# Patient Record
Sex: Male | Born: 1955 | Race: White | Hispanic: No | Marital: Single | Smoking: Current every day smoker
Health system: Southern US, Community
[De-identification: ages and names within clinical notes are randomized; demographics above are authoritative.]

## PROBLEM LIST (undated history)

## (undated) DIAGNOSIS — J449 Chronic obstructive pulmonary disease, unspecified: Secondary | ICD-10-CM

## (undated) DIAGNOSIS — I10 Essential (primary) hypertension: Secondary | ICD-10-CM

## (undated) HISTORY — DX: Chronic obstructive pulmonary disease, unspecified: J44.9

## (undated) HISTORY — DX: Essential (primary) hypertension: I10

## (undated) HISTORY — PX: NO PAST SURGERIES: SHX2092

---

## 2011-08-03 ENCOUNTER — Ambulatory Visit (INDEPENDENT_AMBULATORY_CARE_PROVIDER_SITE_OTHER): Payer: 59 | Admitting: Internal Medicine

## 2011-08-03 ENCOUNTER — Encounter: Payer: Self-pay | Admitting: Internal Medicine

## 2011-08-03 DIAGNOSIS — Z23 Encounter for immunization: Secondary | ICD-10-CM

## 2011-08-03 DIAGNOSIS — I10 Essential (primary) hypertension: Secondary | ICD-10-CM | POA: Insufficient documentation

## 2011-08-03 DIAGNOSIS — Z0000 Encounter for general adult medical examination without abnormal findings: Secondary | ICD-10-CM

## 2011-08-03 LAB — CBC WITH DIFFERENTIAL/PLATELET
Basophils Absolute: 0.1 10*3/uL (ref 0.0–0.1)
Eosinophils Relative: 1.5 % (ref 0.0–5.0)
HCT: 51.1 % (ref 39.0–52.0)
Hemoglobin: 17.3 g/dL — ABNORMAL HIGH (ref 13.0–17.0)
Lymphocytes Relative: 20.3 % (ref 12.0–46.0)
Monocytes Relative: 7.6 % (ref 3.0–12.0)
Neutro Abs: 5.1 10*3/uL (ref 1.4–7.7)
RBC: 5.23 Mil/uL (ref 4.22–5.81)
RDW: 13.3 % (ref 11.5–14.6)
WBC: 7.4 10*3/uL (ref 4.5–10.5)

## 2011-08-03 LAB — COMPREHENSIVE METABOLIC PANEL
ALT: 38 U/L (ref 0–53)
BUN: 7 mg/dL (ref 6–23)
CO2: 29 mEq/L (ref 19–32)
Calcium: 9.5 mg/dL (ref 8.4–10.5)
Chloride: 103 mEq/L (ref 96–112)
Creatinine, Ser: 0.8 mg/dL (ref 0.4–1.5)
GFR: 102.04 mL/min (ref >60.00)
Glucose, Bld: 100 mg/dL — ABNORMAL HIGH (ref 70–99)
Total Bilirubin: 0.8 mg/dL (ref 0.3–1.2)

## 2011-08-03 LAB — LIPID PANEL
HDL: 42.6 mg/dL (ref >39.00)
LDL Cholesterol: 126 mg/dL — ABNORMAL HIGH (ref 0–99)
VLDL: 13.2 mg/dL (ref 0.0–40.0)

## 2011-08-03 MED ORDER — AMLODIPINE BESYLATE 5 MG PO TABS: 5.0000 mg | ORAL_TABLET | Freq: Every day | ORAL | Status: DC

## 2011-08-03 NOTE — Assessment & Plan Note (Addendum)
Patient reports that his systolic blood pressure last week at work was around 200, today his blood pressure is 158/98. Plan: EKG, nsr Labs Start amlodipine 5 mg, side effects discussed, see instructions.

## 2011-08-03 NOTE — Assessment & Plan Note (Addendum)
Last TD? Gave one today we ran out of flushots, rec to get one at the pharmacy Never had a cscope , colon cancer screening reviewed with the patient, pros and cons of colonoscopy versus Hemoccults discussed. Provided an iFOB, will call when ready for a colonoscopy. Counseled about diet, exercise. Risks of tobacco discussed including COPD, cancer. Also recommend to see a dentist at least every 6 months for screening of oral cancer.

## 2011-08-03 NOTE — Progress Notes (Signed)
  Subjective:    Patient ID: Christopher Ferrell, male    DOB: 1955/11/18, 56 y.o.   MRN: [id number]  HPI New patient , complete physical exam. Was found to have high blood pressure last week at his job, systolic BP ~ 200?Marland Kitchen Was recommended  to have a CPX, has not seen a doctor in more than 12 years  Past medical history Elevated BP noted 07-2011  Past surgical history None  Social history Single, lives w/ fiance no children Moved to [place] ~ 2000 from [place] Tobacco-- 1 ppd ETOH-- socially Diet-- regular  Exercise-- active at work, Furniture conservator/restorer , yard work   Family history Diabetes--no CAD--no [place], father, type? Colon cancer--no Prostate cancer--no    Review of Systems  Constitutional: Negative for fever and unexpected weight change.  Respiratory: Negative for cough, shortness of breath and wheezing.   Cardiovascular: Negative for chest pain and leg swelling.  Gastrointestinal: Negative for abdominal pain and blood in stool.  Genitourinary: Negative for dysuria and hematuria.  Neurological: Negative for headaches.  Psychiatric/Behavioral:       No anxiety-depression       Objective:   Physical Exam  Constitutional: He is oriented to person, place, and time. He appears well-developed and well-nourished. No distress.  HENT:  Head: Normocephalic and atraumatic.  Neck: No thyromegaly present.       Normal  carotid pulse  Cardiovascular: Normal rate, regular rhythm and normal heart sounds.   No murmur heard. Pulmonary/Chest: Effort normal and breath sounds normal. No respiratory distress. He has no wheezes. He has no rales.  Abdominal: Soft. He exhibits no distension. There is no tenderness. There is no rebound and no guarding.  Genitourinary: Rectum normal and prostate normal. Guaiac negative stool.  Musculoskeletal: He exhibits no edema.  Neurological: He is alert and oriented to person, place, and time.  Skin: He is not diaphoretic.  Psychiatric: He has  a normal mood and affect. His behavior is normal. Judgment and thought content normal.       Assessment & Plan:

## 2011-08-03 NOTE — Patient Instructions (Signed)
Think about stopping tobacco See a dentist every 6 months Diet! Exercise 30 minutes a day Start amlodipine for BP Check the  blood pressure 2 or 3 times a week, be sure is between 110/60 and 140/85. If it is consistently higher or lower , let me know Came back in 2 months

## 2011-08-04 ENCOUNTER — Encounter: Payer: Self-pay | Admitting: Internal Medicine

## 2011-08-05 LAB — TSH: TSH: 1.08 u[IU]/mL (ref 0.35–5.50)

## 2011-08-09 ENCOUNTER — Telehealth: Payer: Self-pay | Admitting: *Deleted

## 2011-08-09 NOTE — Telephone Encounter (Signed)
Spoke with pt gave lab results

## 2011-08-10 ENCOUNTER — Telehealth: Payer: Self-pay | Admitting: *Deleted

## 2011-08-10 ENCOUNTER — Encounter: Payer: Self-pay | Admitting: *Deleted

## 2011-08-10 NOTE — Telephone Encounter (Signed)
Refill done.  

## 2011-08-10 NOTE — Telephone Encounter (Signed)
Start losartan 100 mg 1 po qd #30, 0 RF Watch for sx of low BP (weak-tired-dizzy), if that is the case, take only 1/2 losartan and let me know  OV to see me in 3 weeks

## 2011-08-10 NOTE — Telephone Encounter (Signed)
Phone pt & scheduled an appt for 2.27.13. Pt would like to know if he needs to continue taking the Norvasc as well as the losartan or just the losartan? Please advise.

## 2011-08-10 NOTE — Telephone Encounter (Signed)
Continue with Norvasc 

## 2011-08-10 NOTE — Telephone Encounter (Signed)
Pt came in to have his BP checked & it was 200/98. Does pt need an OV or med change? Please advise.

## 2011-08-11 ENCOUNTER — Other Ambulatory Visit: Payer: Self-pay | Admitting: *Deleted

## 2011-08-11 MED ORDER — LOSARTAN POTASSIUM 100 MG PO TABS: 100.0000 mg | ORAL_TABLET | Freq: Every day | ORAL | Status: DC

## 2011-08-31 ENCOUNTER — Ambulatory Visit (INDEPENDENT_AMBULATORY_CARE_PROVIDER_SITE_OTHER): Payer: 59 | Admitting: Internal Medicine

## 2011-08-31 VITALS — BP 178/92 | HR 76 | Temp 98.7°F | Wt 222.0 lb

## 2011-08-31 DIAGNOSIS — I10 Essential (primary) hypertension: Secondary | ICD-10-CM

## 2011-08-31 DIAGNOSIS — R972 Elevated prostate specific antigen [PSA]: Secondary | ICD-10-CM

## 2011-08-31 LAB — BASIC METABOLIC PANEL
Chloride: 100 mEq/L (ref 96–112)
Potassium: 4 mEq/L (ref 3.5–5.1)

## 2011-08-31 MED ORDER — AMLODIPINE BESYLATE 10 MG PO TABS: 10.0000 mg | ORAL_TABLET | Freq: Every day | ORAL | Status: DC

## 2011-08-31 MED ORDER — HYDROCHLOROTHIAZIDE 25 MG PO TABS: 25.0000 mg | ORAL_TABLET | Freq: Every day | ORAL | Status: DC

## 2011-08-31 NOTE — Progress Notes (Signed)
  Subjective:    Patient ID: Christopher Ferrell, male    DOB: [birth year], 56 y.o.   MRN: [id number]  HPI Followup from previous visit, his blood pressure was elevated, was started on medication, currently on Cozaar Norvasc. amb BP still elevated at around 170. BP today 178/92. I rechecked his BP personally, I obtained 190/90 in both arms.  Past medical history  HTN dx 07-2011  Past surgical history  None    Review of Systems  does not eat a lot of salt Denies any cough, lower extremity edema, headache, nausea vomiting or diarrhea. Also denies feeling sleepy or unrefreshed in the morning. Very mild snoring. No dysuria or difficulty urinating.     Objective:   Physical Exam  alert, oriented x3, no apparent distress. Lungs are clear to auscultation bilaterally Cardiovascular regular rate and rhythm without murmur. Extremities without edema. Neurological exam alert oriented x3, speech gait and motor intact       Assessment & Plan:

## 2011-08-31 NOTE — Assessment & Plan Note (Addendum)
BP continues to be elevated despite compliance w/ medicines. Review of systems doesn't  point to   sleep apnea Baseline potassium normal Plan: BMP today Increase amlodipine to 10 mg Hydrochlorothiazide Office visit in 3 weeks

## 2011-08-31 NOTE — Assessment & Plan Note (Signed)
asx , recheck in 5 months

## 2011-09-01 ENCOUNTER — Encounter: Payer: Self-pay | Admitting: Internal Medicine

## 2011-09-02 ENCOUNTER — Encounter: Payer: Self-pay | Admitting: *Deleted

## 2011-09-07 ENCOUNTER — Other Ambulatory Visit: Payer: Self-pay | Admitting: Internal Medicine

## 2011-09-08 NOTE — Telephone Encounter (Signed)
Refill done.  

## 2011-09-21 ENCOUNTER — Encounter: Payer: Self-pay | Admitting: Internal Medicine

## 2011-09-21 ENCOUNTER — Ambulatory Visit (INDEPENDENT_AMBULATORY_CARE_PROVIDER_SITE_OTHER): Payer: 59 | Admitting: Internal Medicine

## 2011-09-21 VITALS — BP 158/84 | HR 63 | Temp 98.3°F | Wt 221.0 lb

## 2011-09-21 DIAGNOSIS — I10 Essential (primary) hypertension: Secondary | ICD-10-CM

## 2011-09-21 MED ORDER — AMLODIPINE BESYLATE 10 MG PO TABS: 10.0000 mg | ORAL_TABLET | Freq: Every day | ORAL | Status: DC

## 2011-09-21 MED ORDER — HYDROCHLOROTHIAZIDE 25 MG PO TABS: 25.0000 mg | ORAL_TABLET | Freq: Every day | ORAL | Status: DC

## 2011-09-21 NOTE — Patient Instructions (Signed)
Check the  blood pressure 2 times a week, be sure it is less than 145/85. If it is consistently higher, let me know

## 2011-09-21 NOTE — Progress Notes (Signed)
  Subjective:    Patient ID: Christopher Ferrell, male    DOB: 1956/05/08, 56 y.o.   MRN: [id number]  HPI Hypertension followup, good medication compliance.  Past medical history  HTN dx 07-2011   Past surgical history  None   Review of Systems Denies any nausea, vomiting or diarrhea No chest pain or shortness of breath. No lower extremity edema. Denies LE cramps He does feel slightly tired lately and today, this is going on for a few days only. Ambulatory blood pressure checked x 1 was in the 150s.    Objective:   Physical Exam  Alert oriented x3, no apparent distress Lower extremities without edema      Assessment & Plan:

## 2011-09-21 NOTE — Assessment & Plan Note (Signed)
Since the last office visit, we increased the dose of amlodipine and added a diuretic. Good  compliance, no side effects except for mild fatigue in the afternoon. His blood pressure is better, I think it will continue to improve as he continued to take a higher dose of amlodipine. Plan: No change, RFs done, check a BMP. Will let me know if the  afternoon fatigue continue or get worse.

## 2011-09-22 LAB — BASIC METABOLIC PANEL
CO2: 27 mEq/L (ref 19–32)
Glucose, Bld: 91 mg/dL (ref 70–99)
Potassium: 4 mEq/L (ref 3.5–5.1)
Sodium: 131 mEq/L — ABNORMAL LOW (ref 135–145)

## 2011-09-29 ENCOUNTER — Other Ambulatory Visit: Payer: Self-pay | Admitting: *Deleted

## 2011-09-29 DIAGNOSIS — Z0000 Encounter for general adult medical examination without abnormal findings: Secondary | ICD-10-CM

## 2011-10-03 ENCOUNTER — Ambulatory Visit: Payer: 59 | Admitting: Internal Medicine

## 2011-10-27 ENCOUNTER — Other Ambulatory Visit (INDEPENDENT_AMBULATORY_CARE_PROVIDER_SITE_OTHER): Payer: 59

## 2011-10-27 DIAGNOSIS — I10 Essential (primary) hypertension: Secondary | ICD-10-CM

## 2011-10-27 LAB — BASIC METABOLIC PANEL
BUN: 8 mg/dL (ref 6–23)
CO2: 27 mEq/L (ref 19–32)
Chloride: 96 mEq/L (ref 96–112)
Creatinine, Ser: 0.8 mg/dL (ref 0.4–1.5)

## 2011-10-27 NOTE — Progress Notes (Signed)
Lab only 

## 2011-11-01 ENCOUNTER — Encounter: Payer: Self-pay | Admitting: *Deleted

## 2012-01-30 ENCOUNTER — Encounter: Payer: Self-pay | Admitting: Internal Medicine

## 2012-01-30 ENCOUNTER — Ambulatory Visit (INDEPENDENT_AMBULATORY_CARE_PROVIDER_SITE_OTHER): Payer: 59 | Admitting: Internal Medicine

## 2012-01-30 VITALS — BP 152/80 | HR 72 | Temp 98.2°F | Wt 212.0 lb

## 2012-01-30 DIAGNOSIS — R972 Elevated prostate specific antigen [PSA]: Secondary | ICD-10-CM

## 2012-01-30 DIAGNOSIS — I10 Essential (primary) hypertension: Secondary | ICD-10-CM

## 2012-01-30 MED ORDER — AMLODIPINE BESYLATE 10 MG PO TABS: 10.0000 mg | ORAL_TABLET | Freq: Every day | ORAL | Status: DC

## 2012-01-30 MED ORDER — HYDROCHLOROTHIAZIDE 25 MG PO TABS: 25.0000 mg | ORAL_TABLET | Freq: Every day | ORAL | Status: DC

## 2012-01-30 NOTE — Progress Notes (Signed)
  Subjective:    Patient ID: Christopher Ferrell, male    DOB: [birth year], 56 y.o.   MRN: [id number]  HPI Routine followup Hypertension, good medication compliance, ambulatory BPs are checked weekly, readings range between 125-140/70. Better readings are at night when he relaxes.  Past medical history   HTN dx 07-2011   Past surgical history   None  Social history Single, lives w/ fiance no children Moved to [place] ~ 2000 from [place] Tobacco-- 1 ppd ETOH-- socially Diet-- regular   Exercise-- active at work, Furniture conservator/restorer , yard work   Family history Diabetes--no CAD--no [place], father, type? Colon cancer--no Prostate cancer--no  Review of Systems No chest pain or shortness of breath Note nausea, vomiting, diarrhea. No dysuria or gross hematuria.     Objective:   Physical Exam  General -- alert, well-developed, and well-nourished.   Lungs -- normal respiratory effort, no intercostal retractions, no accessory muscle use, and normal breath sounds.   Heart-- normal rate, regular rhythm, no murmur, and no gallop.   Extremities-- no pretibial edema bilaterally Psych-- Cognition and judgment appear intact. Alert and cooperative with normal attention span and concentration.  not anxious appearing and not depressed appearing.      Assessment & Plan:

## 2012-01-30 NOTE — Patient Instructions (Addendum)
Check the  blood pressure 2 or 3 times a week, be sure it is between 110/60 and 140/85. If it is consistently higher or lower, let me know  

## 2012-01-30 NOTE — Assessment & Plan Note (Signed)
BP today slightly elevated but seems reasonably well controlled at home. No change, we'll continue monitoring his BP, see instructions

## 2012-01-30 NOTE — Assessment & Plan Note (Signed)
asymptomatic, DRE around 07-2011 normal. Plan, check a PSA

## 2012-01-31 ENCOUNTER — Encounter: Payer: Self-pay | Admitting: *Deleted

## 2012-01-31 LAB — PSA: PSA: 1.86 ng/mL (ref 0.10–4.00)

## 2012-03-19 ENCOUNTER — Other Ambulatory Visit: Payer: Self-pay | Admitting: Internal Medicine

## 2012-03-19 MED ORDER — LOSARTAN POTASSIUM 100 MG PO TABS: 100.0000 mg | ORAL_TABLET | Freq: Every day | ORAL | Status: DC

## 2012-03-19 NOTE — Telephone Encounter (Signed)
Refill done.  

## 2012-03-19 NOTE — Telephone Encounter (Signed)
refill losartan 100mg  tablets #30 last fill 9.5.13 no instructions listed Last ov 7.29.13 74-month follow up

## 2012-08-06 ENCOUNTER — Encounter: Payer: Self-pay | Admitting: Internal Medicine

## 2012-08-06 ENCOUNTER — Ambulatory Visit (INDEPENDENT_AMBULATORY_CARE_PROVIDER_SITE_OTHER): Payer: 59 | Admitting: Internal Medicine

## 2012-08-06 VITALS — BP 154/86 | HR 92 | Temp 97.8°F | Ht 75.0 in | Wt 217.0 lb

## 2012-08-06 DIAGNOSIS — Z0000 Encounter for general adult medical examination without abnormal findings: Secondary | ICD-10-CM

## 2012-08-06 DIAGNOSIS — Z23 Encounter for immunization: Secondary | ICD-10-CM

## 2012-08-06 DIAGNOSIS — N529 Male erectile dysfunction, unspecified: Secondary | ICD-10-CM

## 2012-08-06 DIAGNOSIS — Z720 Tobacco use: Secondary | ICD-10-CM

## 2012-08-06 DIAGNOSIS — I10 Essential (primary) hypertension: Secondary | ICD-10-CM

## 2012-08-06 LAB — CBC WITH DIFFERENTIAL/PLATELET
Basophils Relative: 0.8 % (ref 0.0–3.0)
Eosinophils Absolute: 0.1 10*3/uL (ref 0.0–0.7)
Eosinophils Relative: 1.3 % (ref 0.0–5.0)
HCT: 45.9 % (ref 39.0–52.0)
Hemoglobin: 16 g/dL (ref 13.0–17.0)
Lymphs Abs: 1.4 10*3/uL (ref 0.7–4.0)
MCHC: 34.8 g/dL (ref 30.0–36.0)
MCV: 94.9 fl (ref 78.0–100.0)
Monocytes Absolute: 0.8 10*3/uL (ref 0.1–1.0)
Neutro Abs: 5.6 10*3/uL (ref 1.4–7.7)
RBC: 4.84 Mil/uL (ref 4.22–5.81)
WBC: 8.1 10*3/uL (ref 4.5–10.5)

## 2012-08-06 LAB — COMPREHENSIVE METABOLIC PANEL
AST: 29 U/L (ref 0–37)
Albumin: 4.4 g/dL (ref 3.5–5.2)
Alkaline Phosphatase: 87 U/L (ref 39–117)
BUN: 6 mg/dL (ref 6–23)
Creatinine, Ser: 0.8 mg/dL (ref 0.4–1.5)
Potassium: 3.9 mEq/L (ref 3.5–5.1)
Total Bilirubin: 1.2 mg/dL (ref 0.3–1.2)

## 2012-08-06 LAB — LIPID PANEL
LDL Cholesterol: 98 mg/dL (ref 0–99)
Total CHOL/HDL Ratio: 4

## 2012-08-06 LAB — PSA: PSA: 2.86 ng/mL (ref 0.10–4.00)

## 2012-08-06 NOTE — Assessment & Plan Note (Signed)
Risks of tobacco discussed -- COPD, cancer. Information about quitting provided. Also recommend to see a dentist at least every 6 months for screening of oral cancer. She has episodes of cough, decreased breath sounds, suspect COPD. Will check a chest x-ray and PFTs.

## 2012-08-06 NOTE — Assessment & Plan Note (Addendum)
Today, he also reports  ED, Viagra?. I see no contraindication, samples and how to use the medication provided, will call if he likes a refill. Interactions with nitroglycerin discussed

## 2012-08-06 NOTE — Assessment & Plan Note (Addendum)
BP slightly elevated today but better ambulatory BPs. Recommend to keep checking his BP i, to call if more than 140/85 consistently

## 2012-08-06 NOTE — Patient Instructions (Addendum)
Please get your x-ray at the other [facility]  office located at: [address] [place], across from [facility].  Please go to the basement, this is a walk-in facility, they are open from 8:30 to 5:30 PM. Phone number [phone]. --- Please come back in 6 months for a regular checkup

## 2012-08-06 NOTE — Progress Notes (Signed)
  Subjective:    Patient ID: Christopher Ferrell, male    DOB: 1955/08/24, 57 y.o.   MRN: [id number]  HPI CPX  Past Medical History  Diagnosis Date  . Hypertension    Past Surgical History  Procedure Date  . No past surgeries    History   Social History  . Marital Status: Unknown    Spouse Name: N/A    Number of Children: 2  . Years of Education: N/A   Occupational History  . Furniture conservator/restorer    Social History Main Topics  . Smoking status: Current Every Day Smoker    Types: Cigarettes  . Smokeless tobacco: Never Used     Comment: 1/2 ppd   . Alcohol Use: Yes     Comment: socially   . Drug Use: No  . Sexually Active: Not on file   Other Topics Concern  . Not on file   Social History Narrative   Single, lives w/ fiance no children ---Moved to [place] ~ 2000 from [place] --Diet: regular  ---Exercise-- active at work, Furniture conservator/restorer , yard work; no routine exercise     Family history Diabetes--no CAD--no [place], father, type? Colon cancer--no Prostate cancer--no   Review of Systems Ambulatory BPs around 140/80. Good medication compliance. Denies chest pain, shortness of breath. Admits to episodes of cough, mostly in the afternoon, around 4 times a week, + whitish sputum. Denies hemoptysis. He continue smoking ~ half pack a day. Denies anxiety or depression. Also reports ED, since he started to take some of the BP meds. Cannot pin point to which  med exactly is causing the problem. No dysuria, gross hematuria or difficulty urinating    Objective:   Physical Exam General -- alert, well-developed, and well-nourished.   Neck --no thyromegaly , normal carotid pulse Lungs -- normal respiratory effort, no intercostal retractions, no accessory muscle use, and decreased  breath sounds.   Heart-- normal rate, regular rhythm, no murmur, and no gallop.   Abdomen--soft, non-tender, no distention, no masses  Rectal-- No external abnormalities noted. Normal sphincter  tone. No rectal masses or tenderness. Brown stool, Hemoccult negative Prostate:   Prostate is quite large however is soft, nontender, not nodular Neurologic-- alert & oriented X3 and strength normal in all extremities. Psych-- Cognition and judgment appear intact. Alert and cooperative with normal attention span and concentration.  not anxious appearing and not depressed appearing.       Assessment & Plan:

## 2012-08-06 NOTE — Assessment & Plan Note (Addendum)
Last TD 2013 Never got a flu shot  Pneumonia shot today Never had a cscope , again discussed colon cancer screening : pros and cons of colonoscopy versus Hemoccults discussed. Provided an iFOB, will call when ready for a colonoscopy. Counseled about diet, exercise. Prostate is quite large, he is asymptomatic. PSA at some point was elevated but went down to normal without any treatment. Plan: Check a PSA

## 2012-08-07 ENCOUNTER — Ambulatory Visit (INDEPENDENT_AMBULATORY_CARE_PROVIDER_SITE_OTHER)
Admission: RE | Admit: 2012-08-07 | Discharge: 2012-08-07 | Disposition: A | Discharge status: Home / Self Care | Payer: 59 | Source: Ambulatory Visit | Attending: Internal Medicine | Admitting: Internal Medicine

## 2012-08-07 DIAGNOSIS — F17200 Nicotine dependence, unspecified, uncomplicated: Secondary | ICD-10-CM

## 2012-08-07 DIAGNOSIS — Z720 Tobacco use: Secondary | ICD-10-CM

## 2012-08-14 ENCOUNTER — Encounter: Payer: Self-pay | Admitting: *Deleted

## 2012-08-14 MED ORDER — CARVEDILOL 12.5 MG PO TABS: 12.5000 mg | ORAL_TABLET | Freq: Two times a day (BID) | ORAL | Status: DC

## 2012-08-14 NOTE — Addendum Note (Signed)
Addended by: Edwena Felty T on: 08/14/2012 04:39 PM   Modules accepted: Orders

## 2012-08-15 ENCOUNTER — Telehealth: Payer: Self-pay | Admitting: *Deleted

## 2012-08-15 MED ORDER — LOSARTAN POTASSIUM 100 MG PO TABS: 100.0000 mg | ORAL_TABLET | Freq: Every day | ORAL | Status: DC

## 2012-08-15 NOTE — Telephone Encounter (Signed)
Refill done.  

## 2012-08-23 ENCOUNTER — Telehealth: Payer: Self-pay | Admitting: *Deleted

## 2012-08-23 DIAGNOSIS — I10 Essential (primary) hypertension: Secondary | ICD-10-CM

## 2012-08-23 NOTE — Telephone Encounter (Signed)
Discussed with pt. Scheduled lab appt, entered orders.

## 2012-08-23 NOTE — Telephone Encounter (Signed)
Message copied by Nada Maclachlan on Thu Aug 23, 2012  8:35 AM ------      Message from: Marshell Garfinkel      Created: Wed Aug 22, 2012 11:23 AM      Contact: [phone]       Patient states he does not understand his lab results. He would like a call back. ------

## 2012-08-24 ENCOUNTER — Ambulatory Visit (INDEPENDENT_AMBULATORY_CARE_PROVIDER_SITE_OTHER): Payer: 59 | Admitting: Internal Medicine

## 2012-08-24 DIAGNOSIS — F17200 Nicotine dependence, unspecified, uncomplicated: Secondary | ICD-10-CM

## 2012-08-24 DIAGNOSIS — Z720 Tobacco use: Secondary | ICD-10-CM

## 2012-08-24 LAB — PULMONARY FUNCTION TEST

## 2012-08-24 NOTE — Progress Notes (Signed)
PFT done today. 

## 2012-09-03 ENCOUNTER — Telehealth: Payer: Self-pay | Admitting: Internal Medicine

## 2012-09-03 NOTE — Telephone Encounter (Signed)
Advise pt: CXR and PFTs showed mild COPD. First treatment step is to quit tobacco as discussed during OV If he develop sx : persistent cough, SOB --->  let us know

## 2012-09-04 NOTE — Telephone Encounter (Signed)
lmovm for pt to return call.  

## 2012-09-06 NOTE — Telephone Encounter (Signed)
Discussed with pt

## 2012-09-06 NOTE — Telephone Encounter (Signed)
lmovm for pt to return call.  

## 2012-09-17 ENCOUNTER — Other Ambulatory Visit (INDEPENDENT_AMBULATORY_CARE_PROVIDER_SITE_OTHER): Payer: 59

## 2012-09-17 ENCOUNTER — Telehealth: Payer: Self-pay | Admitting: *Deleted

## 2012-09-17 ENCOUNTER — Other Ambulatory Visit: Payer: 59

## 2012-09-17 DIAGNOSIS — I10 Essential (primary) hypertension: Secondary | ICD-10-CM

## 2012-09-17 NOTE — Telephone Encounter (Signed)
Pt came in stating that at the last OV samples of viagra were givin & the pt stated that it worked well & he would now like an rx sent to his pharmacy. Please advise.

## 2012-09-18 LAB — BASIC METABOLIC PANEL
BUN: 10 mg/dL (ref 6–23)
Calcium: 9.2 mg/dL (ref 8.4–10.5)
Creatinine, Ser: 0.9 mg/dL (ref 0.4–1.5)
GFR: 92.56 mL/min (ref >60.00)

## 2012-09-18 MED ORDER — SILDENAFIL CITRATE 100 MG PO TABS: 100.0000 mg | ORAL_TABLET | Freq: Every day | ORAL | Status: DC | PRN

## 2012-09-18 NOTE — Telephone Encounter (Signed)
Pt made aware rx has been sent to pharmacy.

## 2012-09-18 NOTE — Telephone Encounter (Signed)
Done, let pt know.

## 2012-09-20 ENCOUNTER — Encounter: Payer: Self-pay | Admitting: *Deleted

## 2012-10-23 ENCOUNTER — Other Ambulatory Visit: Payer: Self-pay | Admitting: Internal Medicine

## 2012-10-24 NOTE — Telephone Encounter (Signed)
Refill done.  

## 2013-02-04 ENCOUNTER — Ambulatory Visit (INDEPENDENT_AMBULATORY_CARE_PROVIDER_SITE_OTHER): Payer: 59 | Admitting: Internal Medicine

## 2013-02-04 ENCOUNTER — Encounter: Payer: Self-pay | Admitting: Internal Medicine

## 2013-02-04 VITALS — BP 170/85 | HR 63 | Temp 98.2°F | Wt 212.2 lb

## 2013-02-04 DIAGNOSIS — J3489 Other specified disorders of nose and nasal sinuses: Secondary | ICD-10-CM | POA: Insufficient documentation

## 2013-02-04 DIAGNOSIS — J449 Chronic obstructive pulmonary disease, unspecified: Secondary | ICD-10-CM

## 2013-02-04 DIAGNOSIS — F17200 Nicotine dependence, unspecified, uncomplicated: Secondary | ICD-10-CM

## 2013-02-04 DIAGNOSIS — Z720 Tobacco use: Secondary | ICD-10-CM

## 2013-02-04 DIAGNOSIS — I10 Essential (primary) hypertension: Secondary | ICD-10-CM

## 2013-02-04 DIAGNOSIS — Z0000 Encounter for general adult medical examination without abnormal findings: Secondary | ICD-10-CM

## 2013-02-04 NOTE — Assessment & Plan Note (Signed)
Right nostril lesion for a few months, refer to ENT

## 2013-02-04 NOTE — Assessment & Plan Note (Signed)
Chronic smoker, COPD documented by chest x-ray and PFTs 09-2012. Currently asymptomatic. Recently added a beta blockers for BP and fortunately that has not affected him

## 2013-02-04 NOTE — Assessment & Plan Note (Signed)
Since the last time he was here, we discontinue the diuretic due to to hyponatremia, he is now on Coreg. Ambulatory BPs are excellent, BP today slightly elevated. Plan: BMP Continue monitoring his BP, no change.

## 2013-02-04 NOTE — Assessment & Plan Note (Signed)
Likes a colonoscopy referral, will do

## 2013-02-04 NOTE — Patient Instructions (Addendum)
Check the  blood pressure 2 or 3 times a week, be sure it is between 110/60 and 140/85. If it is consistently higher or lower, let me know Next visit by 07-2013 for a physical exam, fasting

## 2013-02-04 NOTE — Assessment & Plan Note (Signed)
Thinking about quitting, counseled and encouraged

## 2013-02-04 NOTE — Progress Notes (Signed)
  Subjective:    Patient ID: Christopher Ferrell, male    DOB: [birth year], 57 y.o.   MRN: [id number]  HPI Followup Hypertension, d/t hyponatremia, he was switch from a diuretic to carvedilol. Good compliance. Ambulatory BPs  128/65, 128/70 with a pulse in the 60s Also, 6 months history of a lesion in the right nostril, increase in size for the last 2 months  Past Medical History  Diagnosis Date  . Hypertension    Past Surgical History  Procedure Laterality Date  . No past surgeries     History   Social History  . Marital Status: Unknown    Spouse Name: N/A    Number of Children: 2  . Years of Education: N/A   Occupational History  . Furniture conservator/restorer    Social History Main Topics  . Smoking status: Current Every Day Smoker    Types: Cigarettes  . Smokeless tobacco: Never Used     Comment: 1/2 ppd   . Alcohol Use: Yes     Comment: socially   . Drug Use: No  . Sexually Active: Not on file   Other Topics Concern  . Not on file   Social History Narrative   Single, lives w/ fiance no children    Moved to [place] ~ 2000 from [place] --             Review of Systems Denies cough, wheezing, shortness of breath. No sputum production. No lower extremity edema    Objective:   Physical Exam  BP 170/85  Pulse 63  Temp(Src) 98.2 F (36.8 C) (Oral)  Wt 212 lb 3.2 oz (96.253 kg)  BMI 26.52 kg/m2  SpO2 96% General -- alert, well-developed, NAD .   HEENT -- R nostril 4 mm skin lesion Lungs -- normal respiratory effort, no intercostal retractions, no accessory muscle use, and decreased breath sounds.   Heart-- normal rate, regular rhythm, no murmur, and no gallop.   Abdomen--soft, non-tender, no distention, no masses, no HSM, no guarding, and no rigidity.   Extremities-- no pretibial edema bilaterally Neurologic-- alert & oriented X3 and strength normal in all extremities. Psych-- Cognition and judgment appear intact. Alert and cooperative with normal attention span and  concentration.  not anxious appearing and not depressed appearing.       Assessment & Plan:

## 2013-02-05 ENCOUNTER — Telehealth: Payer: Self-pay | Admitting: *Deleted

## 2013-02-05 LAB — BASIC METABOLIC PANEL
Calcium: 9.5 mg/dL (ref 8.4–10.5)
GFR: 100.09 mL/min (ref >60.00)
Glucose, Bld: 99 mg/dL (ref 70–99)
Sodium: 135 mEq/L (ref 135–145)

## 2013-02-05 MED ORDER — CARVEDILOL 12.5 MG PO TABS: 12.5000 mg | ORAL_TABLET | Freq: Two times a day (BID) | ORAL | Status: DC

## 2013-02-05 MED ORDER — LOSARTAN POTASSIUM 100 MG PO TABS: 100.0000 mg | ORAL_TABLET | Freq: Every day | ORAL | Status: DC

## 2013-02-05 MED ORDER — SILDENAFIL CITRATE 100 MG PO TABS: 100.0000 mg | ORAL_TABLET | Freq: Every day | ORAL | Status: DC | PRN

## 2013-02-05 MED ORDER — AMLODIPINE BESYLATE 10 MG PO TABS: ORAL_TABLET | ORAL | Status: DC

## 2013-02-05 NOTE — Telephone Encounter (Signed)
Refills done per orders.

## 2013-02-05 NOTE — Telephone Encounter (Signed)
Message copied by Shirlee More I on Tue Feb 05, 2013  1:12 PM ------      Message from: Christopher Ferrell      Created: Mon Feb 04, 2013  5:54 PM       Please RF all his meds for 6 months.      (Viagra #10 and 2 refills) ------

## 2013-02-06 ENCOUNTER — Encounter: Payer: Self-pay | Admitting: *Deleted

## 2013-08-07 ENCOUNTER — Telehealth: Payer: Self-pay

## 2013-08-07 NOTE — Telephone Encounter (Signed)
Left message for call back Non identifiable  

## 2013-08-08 ENCOUNTER — Encounter: Payer: 59 | Admitting: Internal Medicine

## 2013-08-09 ENCOUNTER — Encounter: Payer: Self-pay | Admitting: Internal Medicine

## 2013-08-09 ENCOUNTER — Ambulatory Visit (INDEPENDENT_AMBULATORY_CARE_PROVIDER_SITE_OTHER): Payer: 59 | Admitting: Internal Medicine

## 2013-08-09 VITALS — BP 164/77 | HR 60 | Temp 98.3°F | Ht 75.2 in | Wt 213.0 lb

## 2013-08-09 DIAGNOSIS — Z23 Encounter for immunization: Secondary | ICD-10-CM

## 2013-08-09 DIAGNOSIS — Z0000 Encounter for general adult medical examination without abnormal findings: Secondary | ICD-10-CM

## 2013-08-09 DIAGNOSIS — J3489 Other specified disorders of nose and nasal sinuses: Secondary | ICD-10-CM

## 2013-08-09 DIAGNOSIS — J449 Chronic obstructive pulmonary disease, unspecified: Secondary | ICD-10-CM

## 2013-08-09 DIAGNOSIS — I10 Essential (primary) hypertension: Secondary | ICD-10-CM

## 2013-08-09 LAB — CBC WITH DIFFERENTIAL/PLATELET
BASOS PCT: 0.6 % (ref 0.0–3.0)
Basophils Absolute: 0 10*3/uL (ref 0.0–0.1)
EOS PCT: 3.4 % (ref 0.0–5.0)
Eosinophils Absolute: 0.3 10*3/uL (ref 0.0–0.7)
HEMATOCRIT: 49.9 % (ref 39.0–52.0)
Hemoglobin: 16.6 g/dL (ref 13.0–17.0)
Lymphocytes Relative: 19.7 % (ref 12.0–46.0)
Lymphs Abs: 1.7 10*3/uL (ref 0.7–4.0)
MCHC: 33.3 g/dL (ref 30.0–36.0)
MCV: 99.1 fl (ref 78.0–100.0)
MONO ABS: 0.8 10*3/uL (ref 0.1–1.0)
MONOS PCT: 9.7 % (ref 3.0–12.0)
NEUTROS PCT: 66.6 % (ref 43.0–77.0)
Neutro Abs: 5.7 10*3/uL (ref 1.4–7.7)
PLATELETS: 220 10*3/uL (ref 150.0–400.0)
RBC: 5.03 Mil/uL (ref 4.22–5.81)
RDW: 12.6 % (ref 11.5–14.6)
WBC: 8.5 10*3/uL (ref 4.5–10.5)

## 2013-08-09 NOTE — Progress Notes (Signed)
   Subjective:    Patient ID: Christopher Ferrell, male    DOB: 05-23-1956, 58 y.o.   MRN: [id number]  DOS:  08/09/2013 CPX ,we also discussed other issues  issues , see a/p  Past Medical History  Diagnosis Date  . Hypertension   . COPD (chronic obstructive pulmonary disease)     Past Surgical History  Procedure Laterality Date  . No past surgeries      History   Social History  . Marital Status: Unknown    Spouse Name: N/A    Number of Children: 2  . Years of Education: N/A   Occupational History  . Furniture conservator/restorerroduction Manager    Social History Main Topics  . Smoking status: Current Every Day Smoker    Types: Cigarettes  . Smokeless tobacco: Never Used     Comment: < 1/2 ppd   . Alcohol Use: Yes     Comment: socially   . Drug Use: No  . Sexual Activity: Not on file   Other Topics Concern  . Not on file   Social History Narrative   Single, lives w/ fiance no children    Moved to [place] ~ 2000 from [place]            ROS  Denies chest pain or difficulty breathing. No chronic or  persisting cough or mucus production. Denies nausea, vomiting, diarrhea or blood in the stools. No difficulty urinating or blood in the urine. Noncyclic depression. Has noted a "cyst" on the scrotum, similar to a previous one ( was able to squeeze out some white sebaceous material from the other cysts)      Objective:   Physical Exam BP 164/77  Pulse 60  Temp(Src) 98.3 F (36.8 C)  Ht 6' 3.2" (1.91 m)  Wt 213 lb (96.616 kg)  BMI 26.48 kg/m2  SpO2 98% General -- alert, well-developed, NAD.  Neck --no thyromegaly , normal carotid pulse HEENT-- Not pale.  Lungs -- normal respiratory effort, no intercostal retractions, no accessory muscle use, and normal breath sounds.  Heart-- normal rate, regular rhythm, no murmur.  Abdomen-- Not distended, good bowel sounds,soft, non-tender. No bruit . Rectal-- No external abnormalities noted. Normal sphincter tone. No rectal masses or tenderness. Brown  stool, Hemoccult negative  GU-- At the R scrotum has a 0.5 cm whitish cyst appearing mass, no red or warm. Scrotal contents seems normal. Prostate--Prostate gland firm and smooth, + enlargement, no nodularity, tenderness, mass, asymmetry or induration. Extremities-- no pretibial edema bilaterally  Neurologic--  alert & oriented X3. Speech normal, gait normal, strength normal in all extremities.  Psych-- Cognition and judgment appear intact. Cooperative with normal attention span and concentration. No anxious or depressed appearing.       Assessment & Plan:

## 2013-08-09 NOTE — Assessment & Plan Note (Addendum)
Last TD 2013  Never got a flu shot --  benefits discussed, got one today Pneumonia shot 2014 zostavax discussed   Never had a cscope, was referred but never got a call (per pt)---- re-refer  Counseled about diet, exercise.  Prostate is quite large, he is asymptomatic. PSA at some point was elevated --->  Check a PSA  Labs Diet-exercise-quit tobacco discussed  RTC 6 months  Also has some benign scrotal cyst, recommend observation.

## 2013-08-09 NOTE — Assessment & Plan Note (Addendum)
BP today slightly elevated but reports ambulatory BPs are ~ 148/80. Plan: No change

## 2013-08-09 NOTE — Patient Instructions (Signed)
Get your blood work before you leave   Next visit is for routine check up regards your  blood pressure  in 6 months  No need to come back fasting Please make an appointment     If you need more information about a healthy diet, quit tobacco : visit  the American Heart Association, it  is a great resource online at:  [email or web address]://[email or web address].org/HEARTORG/

## 2013-08-09 NOTE — Assessment & Plan Note (Signed)
Clinically doing well, I did advise him about quitting tobacco . See instructions. Flu shot today.

## 2013-08-09 NOTE — Assessment & Plan Note (Signed)
Saw ENT, see report

## 2013-08-09 NOTE — Progress Notes (Signed)
Pre visit review using our clinic review tool, if applicable. No additional management support is needed unless otherwise documented below in the visit note. 

## 2013-08-11 ENCOUNTER — Encounter: Payer: Self-pay | Admitting: Internal Medicine

## 2013-08-12 ENCOUNTER — Telehealth: Payer: Self-pay | Admitting: Internal Medicine

## 2013-08-12 LAB — LIPID PANEL
CHOL/HDL RATIO: 3
CHOLESTEROL: 157 mg/dL (ref 0–200)
HDL: 57.3 mg/dL (ref >39.00)
LDL CALC: 89 mg/dL (ref 0–99)
TRIGLYCERIDES: 56 mg/dL (ref 0.0–149.0)
VLDL: 11.2 mg/dL (ref 0.0–40.0)

## 2013-08-12 LAB — COMPREHENSIVE METABOLIC PANEL
ALBUMIN: 4.4 g/dL (ref 3.5–5.2)
ALK PHOS: 75 U/L (ref 39–117)
ALT: 28 U/L (ref 0–53)
AST: 25 U/L (ref 0–37)
BILIRUBIN TOTAL: 1.3 mg/dL — AB (ref 0.3–1.2)
BUN: 9 mg/dL (ref 6–23)
CO2: 26 meq/L (ref 19–32)
Calcium: 9.4 mg/dL (ref 8.4–10.5)
Chloride: 99 mEq/L (ref 96–112)
Creatinine, Ser: 0.8 mg/dL (ref 0.4–1.5)
GFR: 108.83 mL/min (ref >60.00)
GLUCOSE: 103 mg/dL — AB (ref 70–99)
Potassium: 4.1 mEq/L (ref 3.5–5.1)
Sodium: 134 mEq/L — ABNORMAL LOW (ref 135–145)
TOTAL PROTEIN: 7.8 g/dL (ref 6.0–8.3)

## 2013-08-12 LAB — TSH: TSH: 0.98 u[IU]/mL (ref 0.35–5.50)

## 2013-08-12 LAB — PSA: PSA: 2.16 ng/mL (ref 0.10–4.00)

## 2013-08-12 NOTE — Telephone Encounter (Signed)
Relevant patient education assigned to patient using Emmi. ° °

## 2013-08-13 ENCOUNTER — Other Ambulatory Visit: Payer: Self-pay | Admitting: Internal Medicine

## 2013-08-13 ENCOUNTER — Encounter: Payer: Self-pay | Admitting: *Deleted

## 2013-08-13 NOTE — Telephone Encounter (Signed)
Unable to reach prior to visit  

## 2013-08-16 ENCOUNTER — Encounter: Payer: Self-pay | Admitting: Internal Medicine

## 2013-08-23 ENCOUNTER — Other Ambulatory Visit: Payer: Self-pay | Admitting: Internal Medicine

## 2013-09-13 ENCOUNTER — Ambulatory Visit (AMBULATORY_SURGERY_CENTER): Payer: Self-pay | Admitting: *Deleted

## 2013-09-13 VITALS — Ht 75.0 in | Wt 215.0 lb

## 2013-09-13 DIAGNOSIS — Z1211 Encounter for screening for malignant neoplasm of colon: Secondary | ICD-10-CM

## 2013-09-13 MED ORDER — MOVIPREP 100 G PO SOLR: ORAL | Status: DC

## 2013-09-13 NOTE — Progress Notes (Signed)
No allergies to eggs or soy. No prior anesthesia.  

## 2013-09-19 ENCOUNTER — Encounter: Payer: Self-pay | Admitting: Internal Medicine

## 2013-09-27 ENCOUNTER — Encounter: Payer: 59 | Admitting: Internal Medicine

## 2013-10-17 ENCOUNTER — Other Ambulatory Visit: Payer: Self-pay | Admitting: Internal Medicine

## 2013-11-14 ENCOUNTER — Encounter: Payer: 59 | Admitting: Internal Medicine

## 2013-12-13 ENCOUNTER — Encounter: Payer: 59 | Admitting: Internal Medicine

## 2014-02-07 ENCOUNTER — Ambulatory Visit: Payer: 59 | Admitting: Internal Medicine

## 2014-02-23 ENCOUNTER — Other Ambulatory Visit: Payer: Self-pay | Admitting: Internal Medicine

## 2014-02-24 ENCOUNTER — Encounter: Payer: Self-pay | Admitting: Internal Medicine

## 2014-02-24 ENCOUNTER — Ambulatory Visit (INDEPENDENT_AMBULATORY_CARE_PROVIDER_SITE_OTHER): Payer: 59 | Admitting: Internal Medicine

## 2014-02-24 VITALS — BP 145/78 | HR 57 | Temp 98.0°F | Wt 214.4 lb

## 2014-02-24 DIAGNOSIS — J449 Chronic obstructive pulmonary disease, unspecified: Secondary | ICD-10-CM

## 2014-02-24 DIAGNOSIS — I10 Essential (primary) hypertension: Secondary | ICD-10-CM

## 2014-02-24 NOTE — Patient Instructions (Signed)
Get your blood work before you leave   Check the  blood pressure 2 or 3 times a week be sure it is between 110/60 and 140/85. Ideal blood pressure is 120/80. If it is consistently higher or lower, let me know  Next visit is for a physical exam by 08-2014,  fasting Please make an appointment        Low-Sodium Eating Plan Sodium raises blood pressure and causes water to be held in the body. Getting less sodium from food will help lower your blood pressure, reduce any swelling, and protect your heart, liver, and kidneys. We get sodium by adding salt (sodium chloride) to food. Most of our sodium comes from canned, boxed, and frozen foods. Restaurant foods, fast foods, and pizza are also very high in sodium. Even if you take medicine to lower your blood pressure or to reduce fluid in your body, getting less sodium from your food is important. WHAT IS MY PLAN? Most people should limit their sodium intake to 2,300 mg a day. Your health care provider recommends that you limit your sodium intake to __________ a day.  WHAT DO I NEED TO KNOW ABOUT THIS EATING PLAN? For the low-sodium eating plan, you will follow these general guidelines:  Choose foods with a % Daily Value for sodium of less than 5% (as listed on the food label).   Use salt-free seasonings or herbs instead of table salt or sea salt.   Check with your health care provider or pharmacist before using salt substitutes.   Eat fresh foods.  Eat more vegetables and fruits.  Limit canned vegetables. If you do use them, rinse them well to decrease the sodium.   Limit cheese to 1 oz (28 g) per day.   Eat lower-sodium products, often labeled as "lower sodium" or "no salt added."  Avoid foods that contain monosodium glutamate (MSG). MSG is sometimes added to [place] food and some canned foods.  Check food labels (Nutrition Facts labels) on foods to learn how much sodium is in one serving.  Eat more home-cooked food and less  restaurant, buffet, and fast food.  When eating at a restaurant, ask that your food be prepared with less salt or none, if possible.  HOW DO I READ FOOD LABELS FOR SODIUM INFORMATION? The Nutrition Facts label lists the amount of sodium in one serving of the food. If you eat more than one serving, you must multiply the listed amount of sodium by the number of servings. Food labels may also identify foods as:  Sodium free--Less than 5 mg in a serving.  Very low sodium--35 mg or less in a serving.  Low sodium--140 mg or less in a serving.  Light in sodium--50% less sodium in a serving. For example, if a food that usually has 300 mg of sodium is changed to become light in sodium, it will have 150 mg of sodium.  Reduced sodium--25% less sodium in a serving. For example, if a food that usually has 400 mg of sodium is changed to reduced sodium, it will have 300 mg of sodium. WHAT FOODS CAN I EAT? Grains Low-sodium cereals, including oats, puffed wheat and rice, and shredded wheat cereals. Low-sodium crackers. Unsalted rice and pasta. Lower-sodium bread.  Vegetables Frozen or fresh vegetables. Low-sodium or reduced-sodium canned vegetables. Low-sodium or reduced-sodium tomato sauce and paste. Low-sodium or reduced-sodium tomato and vegetable juices.  Fruits Fresh, frozen, and canned fruit. Fruit juice.  Meat and Other Protein Products Low-sodium canned tuna and  salmon. Fresh or frozen meat, poultry, seafood, and fish. Lamb. Unsalted nuts. Dried beans, peas, and lentils without added salt. Unsalted canned beans. Homemade soups without salt. Eggs.  Dairy Milk. Soy milk. Ricotta cheese. Low-sodium or reduced-sodium cheeses. Yogurt.  Condiments Fresh and dried herbs and spices. Salt-free seasonings. Onion and garlic powders. Low-sodium varieties of mustard and ketchup. Lemon juice.  Fats and Oils Reduced-sodium salad dressings. Unsalted butter.  Other Unsalted popcorn and pretzels.    The items listed above may not be a complete list of recommended foods or beverages. Contact your dietitian for more options. WHAT FOODS ARE NOT RECOMMENDED? Grains Instant hot cereals. Bread stuffing, pancake, and biscuit mixes. Croutons. Seasoned rice or pasta mixes. Noodle soup cups. Boxed or frozen macaroni and cheese. Self-rising flour. Regular salted crackers. Vegetables Regular canned vegetables. Regular canned tomato sauce and paste. Regular tomato and vegetable juices. Frozen vegetables in sauces. Salted french fries. Olives. Rosita Fire. Relishes. Sauerkraut. Salsa. Meat and Other Protein Products Salted, canned, smoked, spiced, or pickled meats, seafood, or fish. Bacon, ham, sausage, hot dogs, corned beef, chipped beef, and packaged luncheon meats. Salt pork. Jerky. Pickled herring. Anchovies, regular canned tuna, and sardines. Salted nuts. Dairy Processed cheese and cheese spreads. Cheese curds. Blue cheese and cottage cheese. Buttermilk.  Condiments Onion and garlic salt, seasoned salt, table salt, and sea salt. Canned and packaged gravies. Worcestershire sauce. Tartar sauce. Barbecue sauce. Teriyaki sauce. Soy sauce, including reduced sodium. Steak sauce. Fish sauce. Oyster sauce. Cocktail sauce. Horseradish. Regular ketchup and mustard. Meat flavorings and tenderizers. Bouillon cubes. Hot sauce. Tabasco sauce. Marinades. Taco seasonings. Relishes. Fats and Oils Regular salad dressings. Salted butter. Margarine. Ghee. Bacon fat.  Other Potato and tortilla chips. Corn chips and puffs. Salted popcorn and pretzels. Canned or dried soups. Pizza. Frozen entrees and pot pies.  The items listed above may not be a complete list of foods and beverages to avoid. Contact your dietitian for more information. Document Released: 12/10/2001 Document Revised: 06/25/2013 Document Reviewed: 04/24/2013 [facility] Patient Information 2015 [place], [place]. This information is not intended to replace  advice given to you by your health care provider. Make sure you discuss any questions you have with your health care provider.

## 2014-02-24 NOTE — Progress Notes (Signed)
   Subjective:    Patient ID: Christopher Ferrell, male    DOB: [birth year], 58 y.o.   MRN: [id number]  DOS:  02/24/2014 Type of visit - description: rov History: In general the patient is doing well, recent labs reviewed, due for a BMP. Good medication compliance, ambulatory BPs in the low 140s. Had to hold the  colonoscopy due to the his wife's health but plans to proceed with that as soon as possible   ROS Denies chest pain or lower extremity edema COPD stable with some cough without hemoptysis, produces clear sputum. No anxiety- depression  Past Medical History  Diagnosis Date  . Hypertension   . COPD (chronic obstructive pulmonary disease)     Past Surgical History  Procedure Laterality Date  . No past surgeries      History   Social History  . Marital Status: Unknown    Spouse Name: N/A    Number of Children: 2  . Years of Education: N/A   Occupational History  . Furniture conservator/restorer    Social History Main Topics  . Smoking status: Current Every Day Smoker -- 0.75 packs/day    Types: Cigarettes  . Smokeless tobacco: Never Used     Comment: < 1/2 ppd   . Alcohol Use: 3.6 oz/week    6 Cans of beer per week  . Drug Use: No  . Sexual Activity: Not on file   Other Topics Concern  . Not on file   Social History Narrative   Single, lives w/ fiance no children    Moved to [place] ~ 2000 from [place]               Medication List       This list is accurate as of: 02/24/14 11:59 PM.  Always use your most recent med list.               amLODipine 10 MG tablet  Commonly known as:  NORVASC  TAKE 1 TABLET BY MOUTH DAILY     carvedilol 12.5 MG tablet  Commonly known as:  COREG  TAKE 1 TABLET BY MOUTH TWICE DAILY WITH A MEAL     losartan 100 MG tablet  Commonly known as:  COZAAR  TAKE 1 TABLET BY MOUTH EVERY DAY     MOVIPREP 100 G Solr  Generic drug:  peg 3350 powder  moviprep as directed. No substitutions     sildenafil 100 MG tablet  Commonly known as:   VIAGRA  Take 1 tablet (100 mg total) by mouth daily as needed.           Objective:   Physical Exam BP 145/78  Pulse 57  Temp(Src) 98 F (36.7 C) (Oral)  Wt 214 lb 6 oz (97.24 kg)  SpO2 98% General -- alert, well-developed, NAD.   Lungs -- normal respiratory effort, no intercostal retractions, no accessory muscle use, and normal breath sounds.  Heart-- normal rate, regular rhythm, no murmur.   Extremities-- no pretibial edema bilaterally  Neurologic--  alert & oriented X3. Speech normal, gait appropriate for age, strength symmetric and appropriate for age.  Psych-- Cognition and judgment appear intact. Cooperative with normal attention span and concentration. No anxious or depressed appearing.       Assessment & Plan:

## 2014-02-24 NOTE — Progress Notes (Signed)
Pre-visit discussion using our clinic review tool. No additional management support is needed unless otherwise documented below in the visit note.  

## 2014-02-25 LAB — BASIC METABOLIC PANEL
BUN: 8 mg/dL (ref 6–23)
CALCIUM: 9.4 mg/dL (ref 8.4–10.5)
CO2: 25 meq/L (ref 19–32)
CREATININE: 0.8 mg/dL (ref 0.4–1.5)
Chloride: 102 mEq/L (ref 96–112)
GFR: 107.03 mL/min (ref >60.00)
GLUCOSE: 92 mg/dL (ref 70–99)
Potassium: 4.2 mEq/L (ref 3.5–5.1)
Sodium: 136 mEq/L (ref 135–145)

## 2014-02-25 NOTE — Assessment & Plan Note (Signed)
COPD, Nearly asymptomatic, PFTs 08/2013 show mild obstruction of some response to bronchodilators. Recommend to have a flu shot this fall

## 2014-02-25 NOTE — Assessment & Plan Note (Signed)
Hypertension,  ambulatory BPs in the 140s. We talk about possibly increase his medication but eventually agreed to work on diet and exercise, continue with same medicines, check a BMP. The patient will   let me know if the BPs are consistently in the high 140s

## 2014-02-27 ENCOUNTER — Other Ambulatory Visit: Payer: Self-pay | Admitting: Internal Medicine

## 2014-04-04 ENCOUNTER — Ambulatory Visit (AMBULATORY_SURGERY_CENTER): Payer: Self-pay | Admitting: *Deleted

## 2014-04-04 VITALS — Ht 76.0 in | Wt 214.0 lb

## 2014-04-04 DIAGNOSIS — Z1211 Encounter for screening for malignant neoplasm of colon: Secondary | ICD-10-CM

## 2014-04-04 NOTE — Progress Notes (Signed)
No home 02 use. ewm  no egg or soy allergy. ewm No diet pills. ewm  pt has no previous sedation/ no past surgeries. ewm Pt declined emmi. ewm

## 2014-04-15 ENCOUNTER — Ambulatory Visit (INDEPENDENT_AMBULATORY_CARE_PROVIDER_SITE_OTHER): Payer: 59 | Admitting: Internal Medicine

## 2014-04-15 ENCOUNTER — Encounter: Payer: Self-pay | Admitting: Internal Medicine

## 2014-04-15 VITALS — BP 157/74 | HR 55 | Temp 98.4°F | Wt 210.5 lb

## 2014-04-15 DIAGNOSIS — B349 Viral infection, unspecified: Secondary | ICD-10-CM

## 2014-04-15 MED ORDER — AZITHROMYCIN 250 MG PO TABS: ORAL_TABLET | ORAL | Status: DC

## 2014-04-15 NOTE — Patient Instructions (Signed)
Rest, fluids , tylenol If  cough, take Mucinex DM twice a day as needed  If nasal  congestion use OTC Nasocort: 2 nasal sprays on each side of the nose daily until you feel better Continue the OTC you already got  Take the antibiotic as prescribed  -zithromax- only if not improving in 3-4 days Call if not gradually better over the next  10 days Call anytime if the symptoms are severe

## 2014-04-15 NOTE — Progress Notes (Signed)
Pre visit review using our clinic review tool, if applicable. No additional management support is needed unless otherwise documented below in the visit note. 

## 2014-04-15 NOTE — Progress Notes (Signed)
Subjective:    Patient ID: Christopher MiniumRobert C Ferrell, male    DOB: 06-Aug-1955, 58 y.o.   MRN: [id number]  DOS:  04/15/2014 Type of visit - description : acute Interval history: Symptoms started 2 days ago with sore throat, cough, hoarseness, achiness, subjective fevers. Taking OTC Antihistaminic which is helping to dry his nose   ROS Occasional chills. No sinus pain. No chest or difficulty breathing No nausea vomiting, had diarrhea today. No wheezing or chest congestion, no sputum production.   Past Medical History  Diagnosis Date  . Hypertension   . COPD (chronic obstructive pulmonary disease)     Past Surgical History  Procedure Laterality Date  . No past surgeries      History   Social History  . Marital Status: Unknown    Spouse Name: N/A    Number of Children: 2  . Years of Education: N/A   Occupational History  . Furniture conservator/restorerroduction Manager    Social History Main Topics  . Smoking status: Current Every Day Smoker -- 0.75 packs/day    Types: Cigarettes  . Smokeless tobacco: Never Used     Comment: < 1/2 ppd   . Alcohol Use: 3.6 oz/week    6 Cans of beer per week  . Drug Use: No  . Sexual Activity: Not on file   Other Topics Concern  . Not on file   Social History Narrative   Single, lives w/ fiance no children    Moved to [place] ~ 2000 from [place]               Medication List       This list is accurate as of: 04/15/14  6:25 PM.  Always use your most recent med list.               amLODipine 10 MG tablet  Commonly known as:  NORVASC  TAKE 1 TABLET BY MOUTH DAILY     azithromycin 250 MG tablet  Commonly known as:  ZITHROMAX Z-PAK  2 tabs a day the first day, then 1 tab a day x 4 days     carvedilol 12.5 MG tablet  Commonly known as:  COREG  TAKE 1 TABLET BY MOUTH TWICE DAILY WITH A MEAL     losartan 100 MG tablet  Commonly known as:  COZAAR  TAKE 1 TABLET BY MOUTH EVERY DAY     sildenafil 100 MG tablet  Commonly known as:  VIAGRA  Take 1 tablet  (100 mg total) by mouth daily as needed.           Objective:   Physical Exam BP 157/74  Pulse 55  Temp(Src) 98.4 F (36.9 C) (Oral)  Wt 210 lb 8 oz (95.482 kg)  SpO2 98%  General -- alert, well-developed, NAD.  HEENT-- Not pale.  R Ear-- normal L ear-- normal Throat symmetric, no redness or discharge. Face symmetric, sinuses not tender to palpation. Nose   congested. Lungs -- normal respiratory effort, no intercostal retractions, no accessory muscle use, and Slightly decreased breath sounds with few rhonchi. No wheezing or increased work of breathing.  Heart-- normal rate, regular rhythm, no murmur.  Extremities-- no pretibial edema bilaterally  Neurologic--  alert & oriented X3. Speech normal, gait appropriate for age, strength symmetric and appropriate for age.  Psych-- Cognition and judgment appear intact. Cooperative with normal attention span and concentration. No anxious or depressed appearing.     Assessment & Plan:   Viral syndrome, Symptoms most  likely related to viral syndrome, he is a smoker has a history of mild COPD. Recommend conservative treatment and   to start a Z-Pak only  if he is not improving soon. Patient verbalized understanding, see instructions  Recommend a flu shot once she is better

## 2014-04-18 ENCOUNTER — Encounter: Payer: 59 | Admitting: Internal Medicine

## 2014-05-11 IMAGING — CR DG CHEST 2V
3 series · 3 of 3 positions shown · non-contrast
Comparison: None

CLINICAL DATA: Physical exam, smoker, hypertension

CHEST - 2 VIEW

[view not recorded (1 of 3)]
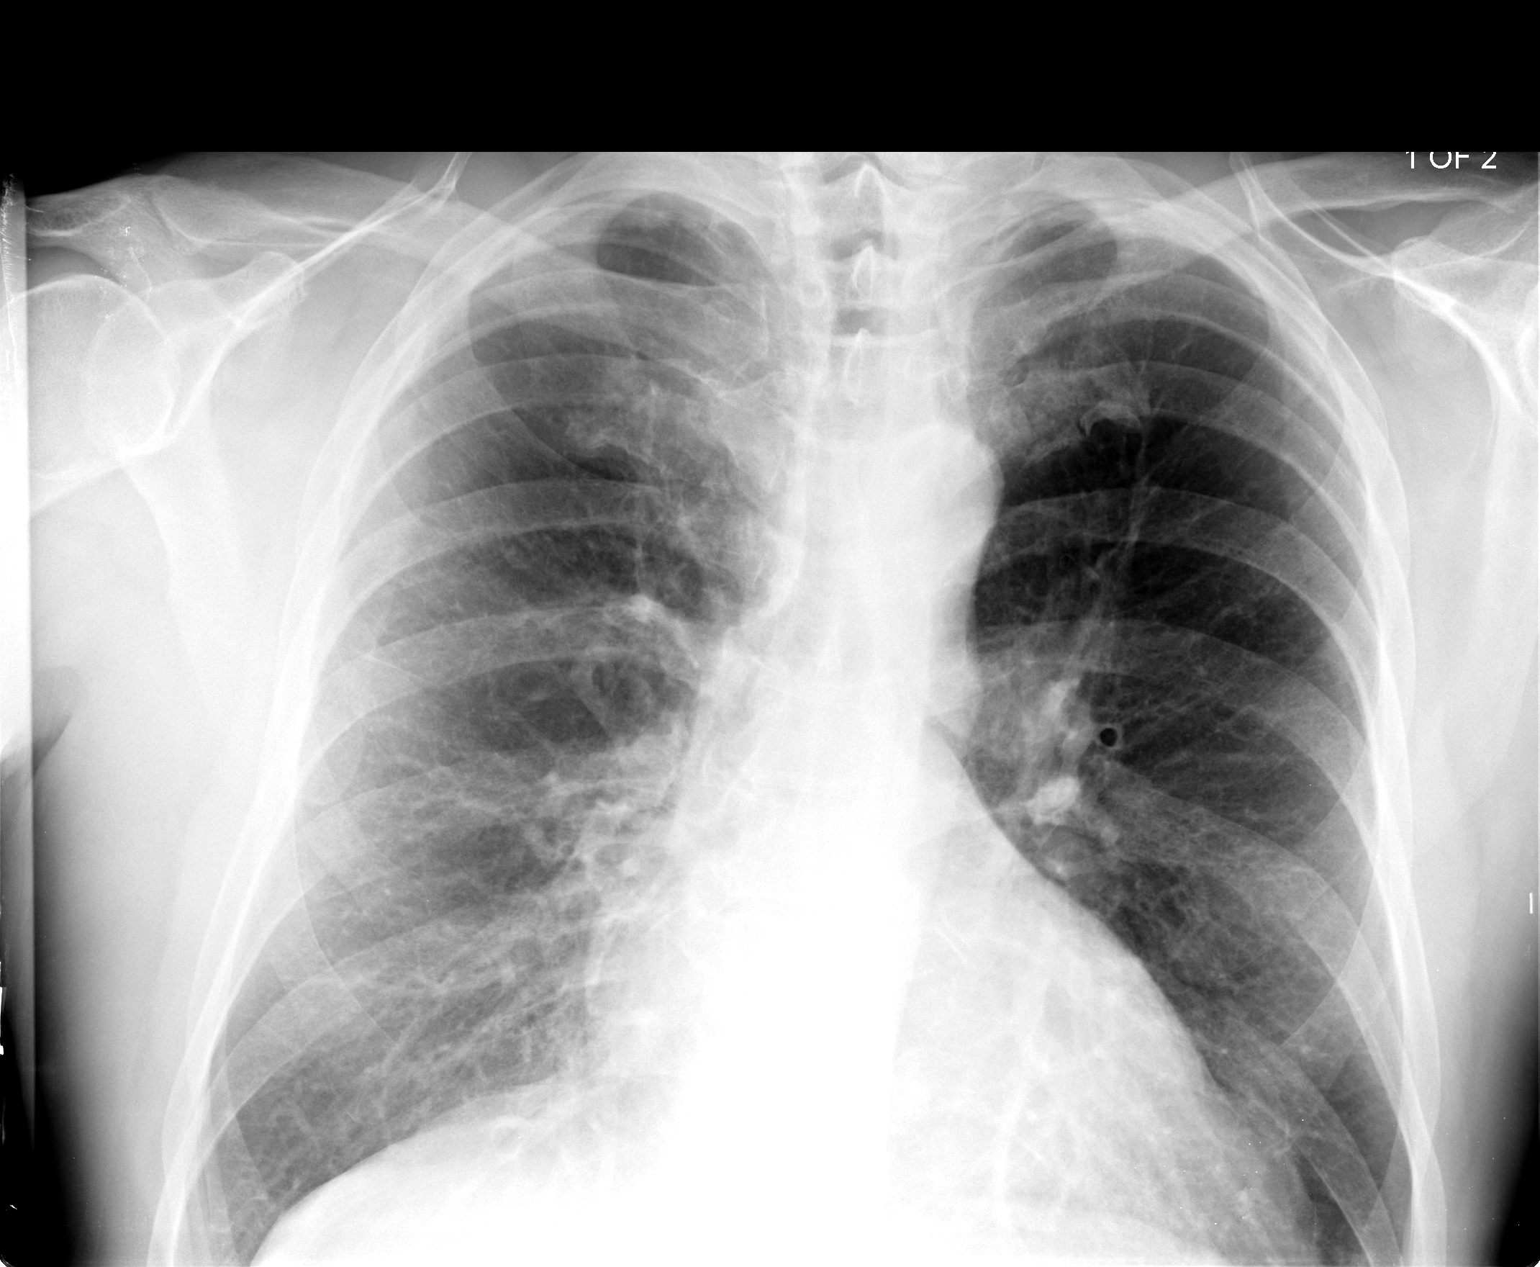

[view not recorded (2 of 3)]
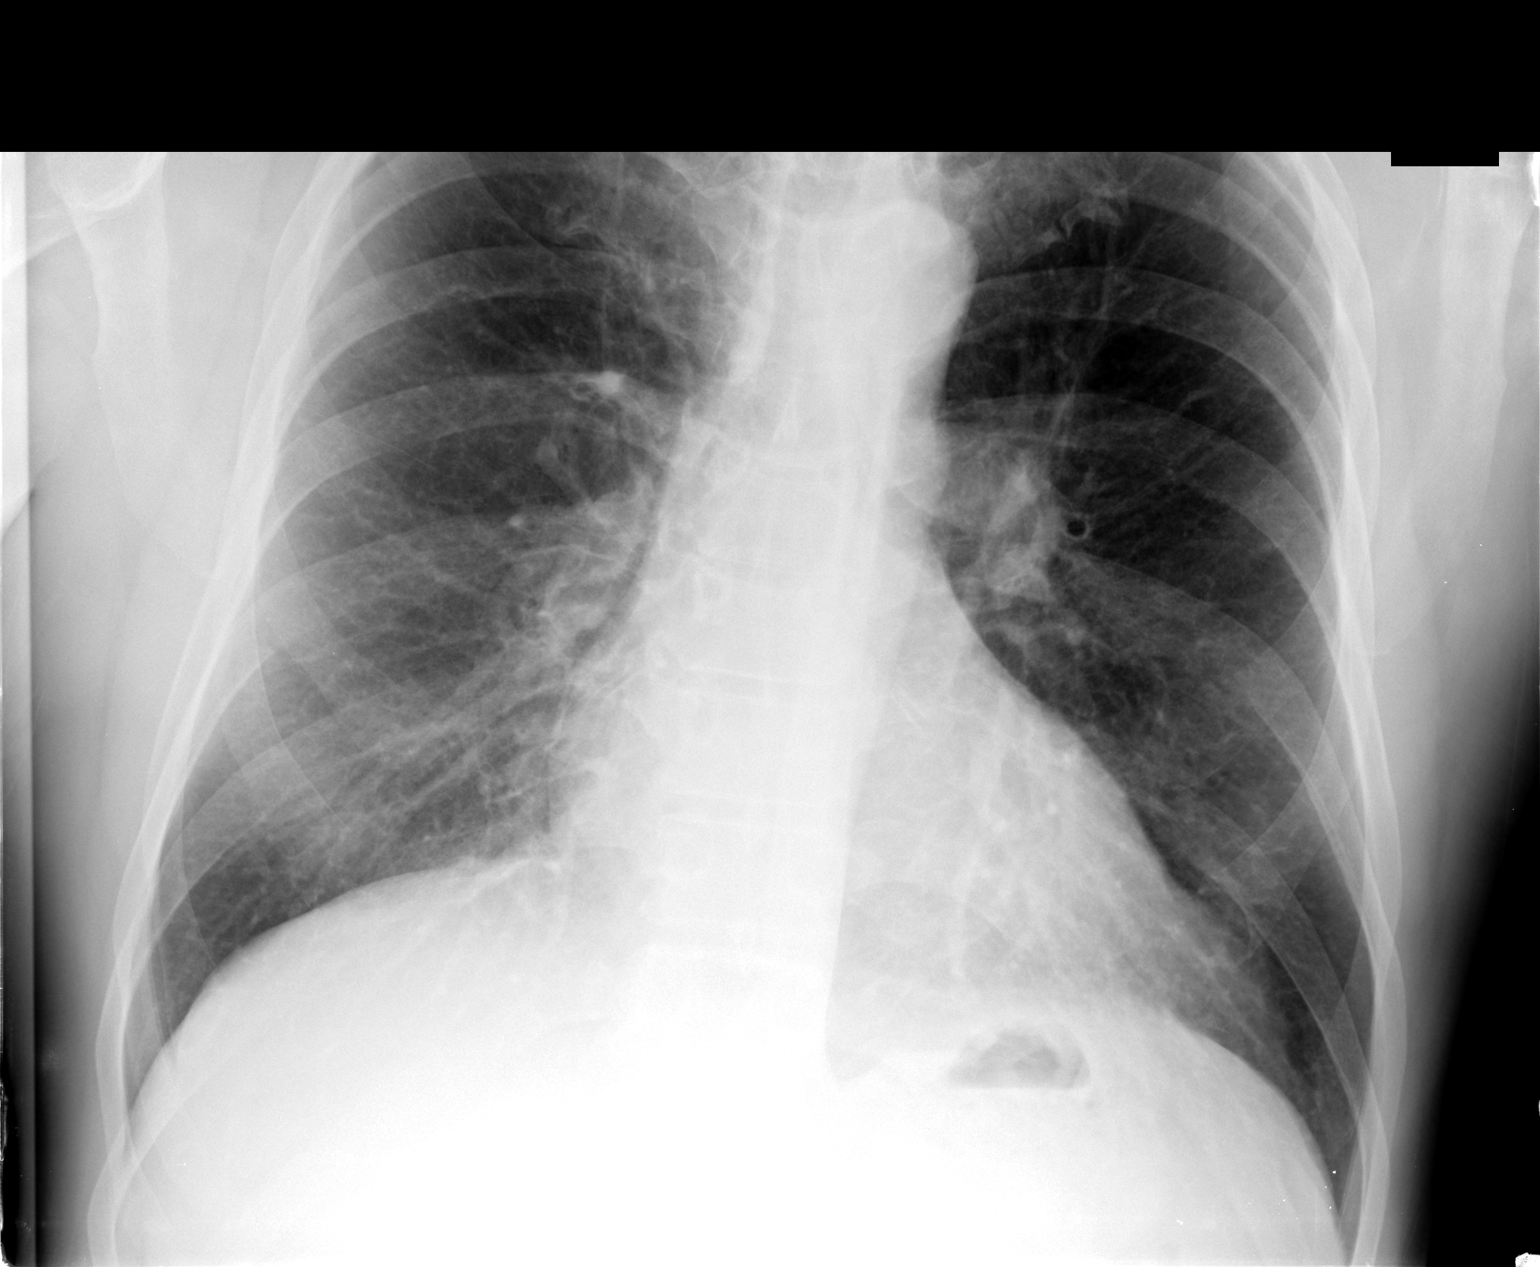

[view not recorded (3 of 3)]
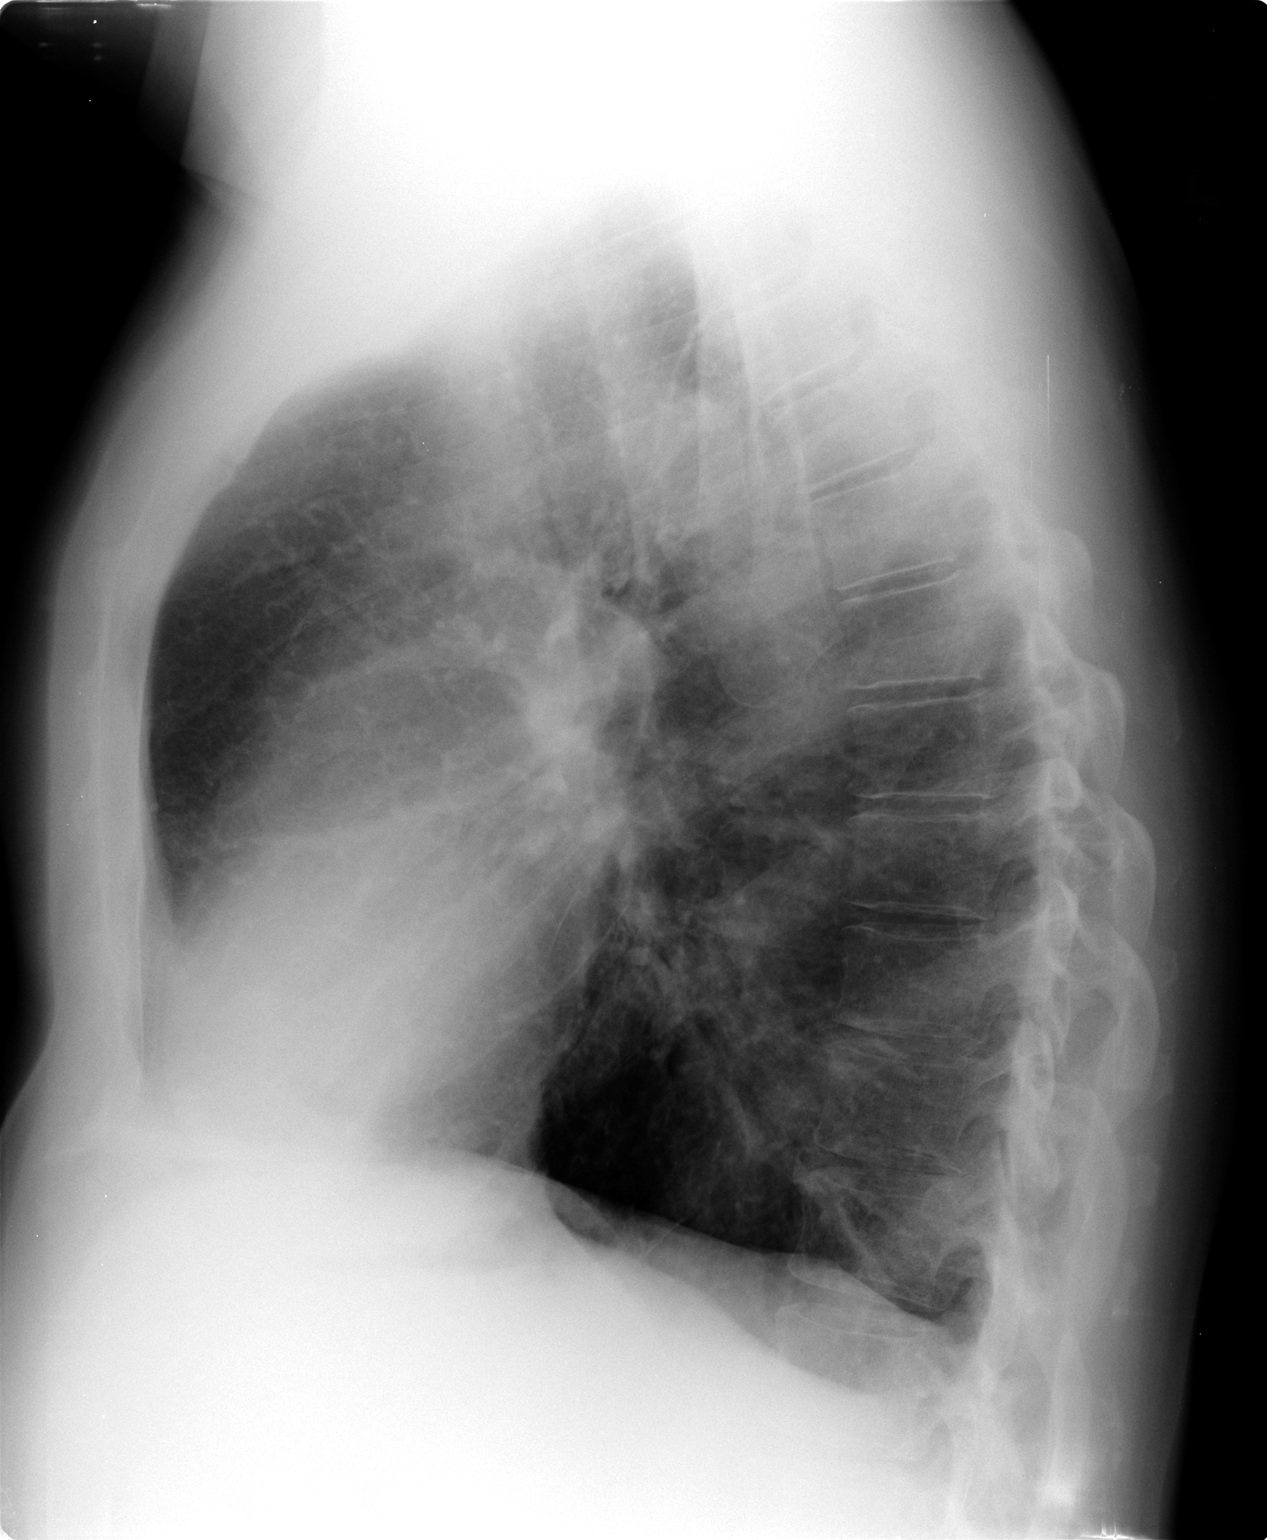

[3 of 3 positions shown; findings below may reference images not displayed]

FINDINGS: Upper normal heart size.
Mediastinal contours and pulmonary vascularity normal.
Bronchitic and minimal emphysematous changes question COPD.
No pulmonary infiltrate, pleural effusion, or pneumothorax.
Bones unremarkable.
IMPRESSION: Bronchitic and minimal emphysematous changes question COPD.
No acute abnormalities.

## 2014-06-23 ENCOUNTER — Encounter: Payer: 59 | Admitting: Internal Medicine

## 2014-08-06 ENCOUNTER — Other Ambulatory Visit: Payer: Self-pay | Admitting: Internal Medicine

## 2014-08-07 NOTE — Telephone Encounter (Signed)
Okay #10 and 2 refills. needs a follow-up this month, please be sure that is arrange.

## 2014-08-07 NOTE — Telephone Encounter (Signed)
Pt requesting refill on Viagra.  Last OV: 04/15/2014 Last Fill: 02/05/2013 # 10 2RF  Okay to refill?

## 2014-08-20 ENCOUNTER — Other Ambulatory Visit: Payer: Self-pay | Admitting: Internal Medicine

## 2014-08-27 ENCOUNTER — Other Ambulatory Visit: Payer: Self-pay | Admitting: Internal Medicine

## 2014-09-01 ENCOUNTER — Other Ambulatory Visit: Payer: Self-pay | Admitting: Internal Medicine

## 2014-11-17 ENCOUNTER — Other Ambulatory Visit: Payer: Self-pay | Admitting: Internal Medicine

## 2014-11-17 ENCOUNTER — Other Ambulatory Visit: Payer: Self-pay

## 2014-11-25 ENCOUNTER — Other Ambulatory Visit: Payer: Self-pay

## 2014-11-25 ENCOUNTER — Other Ambulatory Visit: Payer: Self-pay | Admitting: Internal Medicine

## 2014-11-29 ENCOUNTER — Other Ambulatory Visit: Payer: Self-pay | Admitting: Internal Medicine

## 2014-12-18 ENCOUNTER — Other Ambulatory Visit: Payer: Self-pay | Admitting: Internal Medicine

## 2014-12-26 ENCOUNTER — Ambulatory Visit (INDEPENDENT_AMBULATORY_CARE_PROVIDER_SITE_OTHER): Payer: 59 | Admitting: Internal Medicine

## 2014-12-26 ENCOUNTER — Encounter: Payer: Self-pay | Admitting: Internal Medicine

## 2014-12-26 VITALS — BP 148/92 | HR 63 | Temp 98.2°F | Ht 76.0 in | Wt 210.2 lb

## 2014-12-26 DIAGNOSIS — Z23 Encounter for immunization: Secondary | ICD-10-CM | POA: Diagnosis not present

## 2014-12-26 DIAGNOSIS — I10 Essential (primary) hypertension: Secondary | ICD-10-CM | POA: Diagnosis not present

## 2014-12-26 DIAGNOSIS — J449 Chronic obstructive pulmonary disease, unspecified: Secondary | ICD-10-CM

## 2014-12-26 MED ORDER — SILDENAFIL CITRATE 100 MG PO TABS: 100.0000 mg | ORAL_TABLET | ORAL | Status: DC | PRN

## 2014-12-26 MED ORDER — LOSARTAN POTASSIUM 100 MG PO TABS: 100.0000 mg | ORAL_TABLET | Freq: Every day | ORAL | Status: DC

## 2014-12-26 MED ORDER — AMLODIPINE BESYLATE 10 MG PO TABS: 10.0000 mg | ORAL_TABLET | Freq: Every day | ORAL | Status: DC

## 2014-12-26 MED ORDER — CARVEDILOL 12.5 MG PO TABS: 12.5000 mg | ORAL_TABLET | Freq: Two times a day (BID) | ORAL | Status: DC

## 2014-12-26 NOTE — Progress Notes (Signed)
Pre visit review using our clinic review tool, if applicable. No additional management support is needed unless otherwise documented below in the visit note. 

## 2014-12-26 NOTE — Patient Instructions (Signed)
Go to the lab   Check the  blood pressure 2 or 3 times a month   Be sure your blood pressure is between 110/65 and  145/85.  if it is consistently higher or lower, let me know   

## 2014-12-26 NOTE — Assessment & Plan Note (Signed)
BPs consistently 140s/80s. Refill medications, check a BMP. Doing well

## 2014-12-26 NOTE — Assessment & Plan Note (Signed)
Has make some progress with tobacco abuse, smoking a quarter of a pack a day, working with a cessation program at work. Patient is praised. Provide a Prevnar.

## 2014-12-26 NOTE — Progress Notes (Signed)
   Subjective:    Patient ID: TAVEN ORBIN, male    DOB: 06-Jul-1955, 59 y.o.   MRN: [id number]  DOS:  12/26/2014 Type of visit - description : rov Interval history: Hypertension, good compliance with medication, BPs in the 140s/80s. COPD, still smoking but has cut down to one quarter pack a day.   Review of Systems  Denies chest pain or difficulty breathing No nausea, vomiting, diarrhea No dysuria, gross hematuria.  Past Medical History  Diagnosis Date  . Hypertension   . COPD (chronic obstructive pulmonary disease)     Past Surgical History  Procedure Laterality Date  . No past surgeries      History   Social History  . Marital Status: Unknown    Spouse Name: N/A  . Number of Children: 2  . Years of Education: N/A   Occupational History  . Furniture conservator/restorer    Social History Main Topics  . Smoking status: Current Every Day Smoker -- 0.75 packs/day    Types: Cigarettes  . Smokeless tobacco: Never Used     Comment: < 1/2 ppd   . Alcohol Use: 3.6 oz/week    6 Cans of beer per week  . Drug Use: No  . Sexual Activity: Not on file   Other Topics Concern  . Not on file   Social History Narrative   Single, lives w/ fiance no children    Moved to [place] ~ 2000 from [place]               Medication List       This list is accurate as of: 12/26/14 11:59 PM.  Always use your most recent med list.               amLODipine 10 MG tablet  Commonly known as:  NORVASC  Take 1 tablet (10 mg total) by mouth daily.     carvedilol 12.5 MG tablet  Commonly known as:  COREG  Take 1 tablet (12.5 mg total) by mouth 2 (two) times daily with a meal.     losartan 100 MG tablet  Commonly known as:  COZAAR  Take 1 tablet (100 mg total) by mouth daily.     sildenafil 100 MG tablet  Commonly known as:  VIAGRA  Take 1 tablet (100 mg total) by mouth as needed for erectile dysfunction.           Objective:   Physical Exam BP 148/92 mmHg  Pulse 63  Temp(Src) 98.2  F (36.8 C) (Oral)  Ht 6\' 4"  (1.93 m)  Wt 210 lb 4 oz (95.369 kg)  BMI 25.60 kg/m2  SpO2 97% General:   Well developed, well nourished . NAD.  HEENT:  Normocephalic . Face symmetric, atraumatic Lungs:  CTA B Normal respiratory effort, no intercostal retractions, no accessory muscle use. Heart: RRR,  no murmur.  No pretibial edema bilaterally  Skin: Not pale. Not jaundice Neurologic:  alert & oriented X3.  Speech normal, gait appropriate for age and unassisted Psych--  Cognition and judgment appear intact.  Cooperative with normal attention span and concentration.  Behavior appropriate. No anxious or depressed appearing.      Assessment & Plan:

## 2014-12-27 LAB — BASIC METABOLIC PANEL
BUN: 9 mg/dL (ref 6–23)
CALCIUM: 9.3 mg/dL (ref 8.4–10.5)
CHLORIDE: 104 meq/L (ref 96–112)
CO2: 26 meq/L (ref 19–32)
CREATININE: 0.76 mg/dL (ref 0.50–1.35)
GLUCOSE: 92 mg/dL (ref 70–99)
POTASSIUM: 4.2 meq/L (ref 3.5–5.3)
SODIUM: 137 meq/L (ref 135–145)

## 2014-12-31 ENCOUNTER — Other Ambulatory Visit: Payer: Self-pay | Admitting: Internal Medicine

## 2015-03-23 ENCOUNTER — Encounter: Payer: Self-pay | Admitting: Medical

## 2015-03-23 ENCOUNTER — Ambulatory Visit (INDEPENDENT_AMBULATORY_CARE_PROVIDER_SITE_OTHER): Payer: 59 | Admitting: Medical

## 2015-03-23 VITALS — BP 150/66 | HR 66 | Temp 98.7°F | Resp 16 | Ht 76.0 in | Wt 212.0 lb

## 2015-03-23 DIAGNOSIS — R61 Generalized hyperhidrosis: Secondary | ICD-10-CM | POA: Diagnosis not present

## 2015-03-23 DIAGNOSIS — J0100 Acute maxillary sinusitis, unspecified: Secondary | ICD-10-CM

## 2015-03-23 MED ORDER — CEFTRIAXONE SODIUM 1 G IJ SOLR
1.0000 g | Freq: Once | INTRAMUSCULAR | Status: AC
  Administered 2015-03-23: 1 g via INTRAMUSCULAR

## 2015-03-23 MED ORDER — AMOXICILLIN-POT CLAVULANATE 875-125 MG PO TABS: 1.0000 | ORAL_TABLET | Freq: Two times a day (BID) | ORAL | Status: DC

## 2015-03-23 NOTE — Progress Notes (Signed)
Pre visit review using our clinic review tool, if applicable. No additional management support is needed unless otherwise documented below in the visit note. 

## 2015-03-23 NOTE — Progress Notes (Signed)
Subjective:    Patient ID: Christopher Ferrell, male    DOB: 1956/01/30, 59 y.o.   MRN: [id number]  HPI  Pt in with some sinus pressure. Last 3 days got very sore. Worse on left side. Some nasal congested and runny nose. Not much sneezing. Some eye burning. Some rt maxillary pressure now after starting on the left side.  Pt doe not report hx of chronic sinus infections.   Pt has upcoming dental procedure coming up in 3 wks. Both upper and lower teeth.  Last night felt little sweaty. Did not check his temperature.  Pt is trying to get scheduled with oral surgeon.    Review of Systems  Constitutional: Negative for chills and diaphoresis.  HENT: Positive for sinus pressure. Negative for congestion, ear discharge, ear pain, postnasal drip and sore throat.   Respiratory: Negative for cough, choking, shortness of breath and wheezing.   Cardiovascular: Negative for chest pain and palpitations.  Neurological: Negative for dizziness, light-headedness and headaches.  Hematological: Negative for adenopathy. Does not bruise/bleed easily.      Past Medical History  Diagnosis Date  . Hypertension   . COPD (chronic obstructive pulmonary disease)     Social History   Social History  . Marital Status: Unknown    Spouse Name: N/A  . Number of Children: 2  . Years of Education: N/A   Occupational History  . Furniture conservator/restorer    Social History Main Topics  . Smoking status: Current Every Day Smoker -- 0.75 packs/day    Types: Cigarettes  . Smokeless tobacco: Never Used     Comment: < 1/2 ppd   . Alcohol Use: 3.6 oz/week    6 Cans of beer per week  . Drug Use: No  . Sexual Activity: Not on file   Other Topics Concern  . Not on file   Social History Narrative   Single, lives w/ fiance no children    Moved to [place] ~ 2000 from [place]           Past Surgical History  Procedure Laterality Date  . No past surgeries      Family History  Problem Relation Age of Onset  . CAD Neg  Hx   . Stroke Neg Hx   . Diabetes Neg Hx   . Colon cancer Neg Hx   . Prostate cancer Neg Hx   . Rectal cancer Neg Hx   . Stomach cancer Neg Hx   . Cancer Father     type?    No Known Allergies  Current Outpatient Prescriptions on File Prior to Visit  Medication Sig Dispense Refill  . amLODipine (NORVASC) 10 MG tablet Take 1 tablet (10 mg total) by mouth daily. 90 tablet 1  . carvedilol (COREG) 12.5 MG tablet Take 1 tablet (12.5 mg total) by mouth 2 (two) times daily with a meal. 180 tablet 1  . losartan (COZAAR) 100 MG tablet Take 1 tablet (100 mg total) by mouth daily. 90 tablet 1  . sildenafil (VIAGRA) 100 MG tablet Take 1 tablet (100 mg total) by mouth as needed for erectile dysfunction. 10 tablet 2   No current facility-administered medications on file prior to visit.    BP 150/66 mmHg  Pulse 66  Temp(Src) 98.7 F (37.1 C) (Oral)  Resp 16  Ht  (1.93 m)  Wt 212 lb (96.163 kg)  BMI 25.82 kg/m2  SpO2 100%       Objective:   Physical Exam  General  Mental Status - Alert. General Appearance - Well groomed. Not in acute distress.  Skin Rashes- No Rashes.  HEENT Head- Normal. Ear Auditory Canal - Left- Normal. Right - Normal.Tympanic Membrane- Left- Normal. Right- Normal. Eye Sclera/Conjunctiva- Left- Normal. Right- Normal. Nose & Sinuses Nasal Mucosa- Left-  Boggy and Congested. Right-  Boggy and  Congested.Bilateral maxillary sinus tender left side>than rt side. NO  frontal sinus pressure. Mouth & Throat Lips: Upper Lip- Normal: no dryness, cracking, pallor, cyanosis, or vesicular eruption. Lower Lip-Normal: no dryness, cracking, pallor, cyanosis or vesicular eruption. Buccal Mucosa- Bilateral- No Aphthous ulcers. Oropharynx- upper palate swollen and red appearance(posterior pharynx does not look swollen). Floor of mouth not swollen.Upper teeth are missing but he does have 3-4 teeth still remain. Tonsils: Characteristics- Bilateral- No Erythema or  Congestion. Size/Enlargement- Bilateral- No enlargement. Discharge- bilateral-None.  Neck Neck- Supple. No Masses.no lymphadenopathyy  Chest and Lung Exam Auscultation: Breath Sounds:-Clear even and unlabored.  Cardiovascular Auscultation:Rythm- Regular, rate and rhythm. Murmurs & Other Heart Sounds:Ausculatation of the heart reveal- No Murmurs.  Lymphatic Head & Neck General Head & Neck Lymphatics: Bilateral: Description- No Localized lymphadenopathy.        Assessment & Plan:  You appear to have sinus infection and may have tooth related infection as well.  We gave you rocephin 1 gram in office today. Start augmentin. Get cbc today in office.   Follow up on wed am for recheck. Would ask you call your insurance and find out name of the oral surgeon that you will see in 3 wks. May go ahead and try to get your earlier appointment dependning on how things look on Wednesday.  If you get difficulty swallowing or have excess salivation then ED evaluation.

## 2015-03-23 NOTE — Patient Instructions (Addendum)
You appear to have sinus infection and may have tooth related infection as well.  We gave you rocephin 1 gram in office today. Start augmentin. Get cbc today in office.   Follow up on wed am for recheck. Would ask you call your insurance and find out name of the oral surgeon that you will see in 3 wks. May go ahead and try to get your earlier appointment dependning on how things look on Wednesday.  If you get difficulty swallowing or have excess salivation then ED evaluation.

## 2015-03-24 LAB — CBC WITH DIFFERENTIAL/PLATELET
BASOS ABS: 0 10*3/uL (ref 0.0–0.1)
BASOS PCT: 0.4 % (ref 0.0–3.0)
EOS ABS: 0.3 10*3/uL (ref 0.0–0.7)
Eosinophils Relative: 2.6 % (ref 0.0–5.0)
HCT: 47.9 % (ref 39.0–52.0)
HEMOGLOBIN: 16.2 g/dL (ref 13.0–17.0)
Lymphocytes Relative: 11.7 % — ABNORMAL LOW (ref 12.0–46.0)
Lymphs Abs: 1.5 10*3/uL (ref 0.7–4.0)
MCHC: 33.8 g/dL (ref 30.0–36.0)
MCV: 96 fl (ref 78.0–100.0)
MONO ABS: 1.4 10*3/uL — AB (ref 0.1–1.0)
Monocytes Relative: 11.3 % (ref 3.0–12.0)
Neutro Abs: 9.3 10*3/uL — ABNORMAL HIGH (ref 1.4–7.7)
Neutrophils Relative %: 74 % (ref 43.0–77.0)
Platelets: 234 10*3/uL (ref 150.0–400.0)
RBC: 4.99 Mil/uL (ref 4.22–5.81)
RDW: 13.4 % (ref 11.5–15.5)
WBC: 12.6 10*3/uL — AB (ref 4.0–10.5)

## 2015-03-25 ENCOUNTER — Encounter: Payer: Self-pay | Admitting: Medical

## 2015-03-25 ENCOUNTER — Ambulatory Visit (INDEPENDENT_AMBULATORY_CARE_PROVIDER_SITE_OTHER): Payer: 59 | Admitting: Medical

## 2015-03-25 VITALS — BP 120/70 | HR 81 | Temp 98.2°F | Resp 16 | Ht 76.0 in | Wt 208.2 lb

## 2015-03-25 DIAGNOSIS — D72829 Elevated white blood cell count, unspecified: Secondary | ICD-10-CM

## 2015-03-25 DIAGNOSIS — J0100 Acute maxillary sinusitis, unspecified: Secondary | ICD-10-CM

## 2015-03-25 DIAGNOSIS — K047 Periapical abscess without sinus: Secondary | ICD-10-CM

## 2015-03-25 LAB — CBC WITH DIFFERENTIAL/PLATELET
BASOS PCT: 0.8 % (ref 0.0–3.0)
Basophils Absolute: 0.1 10*3/uL (ref 0.0–0.1)
EOS PCT: 2.6 % (ref 0.0–5.0)
Eosinophils Absolute: 0.2 10*3/uL (ref 0.0–0.7)
HEMATOCRIT: 49.7 % (ref 39.0–52.0)
HEMOGLOBIN: 16.9 g/dL (ref 13.0–17.0)
LYMPHS PCT: 17.3 % (ref 12.0–46.0)
Lymphs Abs: 1.6 10*3/uL (ref 0.7–4.0)
MCHC: 34 g/dL (ref 30.0–36.0)
MCV: 96.2 fl (ref 78.0–100.0)
MONO ABS: 1.1 10*3/uL — AB (ref 0.1–1.0)
MONOS PCT: 11.7 % (ref 3.0–12.0)
Neutro Abs: 6.4 10*3/uL (ref 1.4–7.7)
Neutrophils Relative %: 67.6 % (ref 43.0–77.0)
Platelets: 250 10*3/uL (ref 150.0–400.0)
RBC: 5.16 Mil/uL (ref 4.22–5.81)
RDW: 13.2 % (ref 11.5–15.5)
WBC: 9.4 10*3/uL (ref 4.0–10.5)

## 2015-03-25 MED ORDER — CEFTRIAXONE SODIUM 1 G IJ SOLR
1.0000 g | Freq: Once | INTRAMUSCULAR | Status: AC
  Administered 2015-03-25: 1 g via INTRAMUSCULAR

## 2015-03-25 NOTE — Patient Instructions (Signed)
Much improved today.  Will give rocephin 1 gram again in office. Continue augmentin. Get cbc today.   Will go ahead and refer to oral surgeon since prior presentation may reoccur in future.  Follow up in 10 days or as needed

## 2015-03-25 NOTE — Progress Notes (Signed)
Pre visit review using our clinic review tool, if applicable. No additional management support is needed unless otherwise documented below in the visit note. 

## 2015-03-25 NOTE — Progress Notes (Signed)
Subjective:    Patient ID: Christopher Ferrell, male    DOB: 02-04-1956, 59 y.o.   MRN: [id number]  HPI  Pt states yesterday around 8:30 he noticed the lt maxillary sinus pressure and palate no longer tender. Before last night  he rested at home but was intermittently sweating during the day. Today he states feels very good today. State night and day difference compared to other day.  Pt does have records from dentist. He will take those with him when sees oral surgeon.   Review of Systems  Constitutional: Positive for diaphoresis. Negative for fever, chills and fatigue.       Yesterday sweating. But not today.  HENT: Negative for congestion, ear discharge, ear pain, postnasal drip, sinus pressure and sore throat.        Sinus pressure not resolved.  Respiratory: Negative for cough, choking, shortness of breath and wheezing.   Cardiovascular: Negative for chest pain and palpitations.  Musculoskeletal: Negative for back pain.  Skin: Negative for rash.  Neurological: Negative for dizziness, light-headedness and headaches.  Hematological: Negative for adenopathy. Does not bruise/bleed easily.     Past Medical History  Diagnosis Date  . Hypertension   . COPD (chronic obstructive pulmonary disease)     Social History   Social History  . Marital Status: Unknown    Spouse Name: N/A  . Number of Children: 2  . Years of Education: N/A   Occupational History  . Furniture conservator/restorer    Social History Main Topics  . Smoking status: Current Every Day Smoker -- 0.75 packs/day    Types: Cigarettes  . Smokeless tobacco: Never Used     Comment: < 1/2 ppd   . Alcohol Use: 3.6 oz/week    6 Cans of beer per week  . Drug Use: No  . Sexual Activity: Not on file   Other Topics Concern  . Not on file   Social History Narrative   Single, lives w/ fiance no children    Moved to [place] ~ 2000 from [place]           Past Surgical History  Procedure Laterality Date  . No past surgeries       Family History  Problem Relation Age of Onset  . CAD Neg Hx   . Stroke Neg Hx   . Diabetes Neg Hx   . Colon cancer Neg Hx   . Prostate cancer Neg Hx   . Rectal cancer Neg Hx   . Stomach cancer Neg Hx   . Cancer Father     type?    No Known Allergies  Current Outpatient Prescriptions on File Prior to Visit  Medication Sig Dispense Refill  . amLODipine (NORVASC) 10 MG tablet Take 1 tablet (10 mg total) by mouth daily. 90 tablet 1  . amoxicillin-clavulanate (AUGMENTIN) 875-125 MG per tablet Take 1 tablet by mouth 2 (two) times daily. 20 tablet 0  . carvedilol (COREG) 12.5 MG tablet Take 1 tablet (12.5 mg total) by mouth 2 (two) times daily with a meal. 180 tablet 1  . losartan (COZAAR) 100 MG tablet Take 1 tablet (100 mg total) by mouth daily. 90 tablet 1  . sildenafil (VIAGRA) 100 MG tablet Take 1 tablet (100 mg total) by mouth as needed for erectile dysfunction. 10 tablet 2   No current facility-administered medications on file prior to visit.    BP 120/70 mmHg  Pulse 81  Temp(Src) 98.2 F (36.8 C) (Oral)  Resp 16  Ht  (1.93 m)  Wt 208 lb 3.2 oz (94.439 kg)  BMI 25.35 kg/m2  SpO2 98%       Objective:   Physical Exam  General  Mental Status - Alert. General Appearance - Well groomed. Not in acute distress.  Skin Rashes- No Rashes.  HEENT Head- Normal. Ear Auditory Canal - Left- Normal. Right - Normal.Tympanic Membrane- Left- Normal. Right- Normal. Eye Sclera/Conjunctiva- Left- Normal. Right- Normal. Nose & Sinuses Nasal Mucosa- Left- Boggy and Congested. Right- Boggy and Congested.Bilateral  No maxillary sinus tenderness.  NO frontal sinus pressure. Mouth & Throat Lips: Upper Lip- Normal: no dryness, cracking, pallor, cyanosis, or vesicular eruption. Lower Lip-Normal: no dryness, cracking, pallor, cyanosis or vesicular eruption. Buccal Mucosa- Bilateral- No Aphthous ulcers. Oropharynx- upper palate swolleng has gone down a lot by about 70%. Some  mild residual swelling upper palate. Floor of mouth not swollen.Upper teeth are missing but he does have 3-4 teeth still remain. Tonsils: Characteristics- Bilateral- No Erythema or Congestion. Size/Enlargement- Bilateral- No enlargement. Discharge- bilateral-None.  Neck Neck- Supple. No Masses.no lymphadenopathyy  Chest and Lung Exam Auscultation: Breath Sounds:-Clear even and unlabored.  Cardiovascular Auscultation:Rythm- Regular, rate and rhythm. Murmurs & Other Heart Sounds:Ausculatation of the heart reveal- No Murmurs.  Lymphatic Head & Neck General Head & Neck Lymphatics: Bilateral: Description- No Localized lymphadenopathy.      Assessment & Plan:  Much improved today.  Will give rocephin 1 gram again in office. Continue augmentin. Get cbc today.   Will go ahead and refer to oral surgeon since prior presentation may reoccur in future.  Follow up in 10 days or as needed

## 2015-06-01 ENCOUNTER — Encounter: Payer: 59 | Admitting: Internal Medicine

## 2015-06-01 ENCOUNTER — Telehealth: Payer: Self-pay | Admitting: Internal Medicine

## 2015-06-10 NOTE — Telephone Encounter (Signed)
No at this time

## 2015-06-10 NOTE — Telephone Encounter (Signed)
Pt was no show 06/01/15 3:00pm for cpe, I left msg for pt to call and reschedule, 1st no show I see, charge or no charge?

## 2015-06-10 NOTE — Telephone Encounter (Signed)
Will defer to Dr. Drue NovelPaz, charge or no charge?

## 2015-06-21 ENCOUNTER — Other Ambulatory Visit: Payer: Self-pay | Admitting: Internal Medicine

## 2015-06-29 ENCOUNTER — Other Ambulatory Visit: Payer: Self-pay | Admitting: Internal Medicine

## 2015-07-26 ENCOUNTER — Other Ambulatory Visit: Payer: Self-pay | Admitting: Internal Medicine

## 2015-08-03 ENCOUNTER — Telehealth: Payer: Self-pay | Admitting: Internal Medicine

## 2015-08-03 NOTE — Telephone Encounter (Signed)
°  Reason for call: Also needs refill on carvedilol (COREG) 12.5 MG tablet [[id number]]

## 2015-08-03 NOTE — Telephone Encounter (Signed)
Caller name: Self   Can be reached: [phone]  Pharmacy:  [id number] - [address] [place] - [address] AT [facility] [address] [phone] (Phone) [phone] (Fax)        Reason for call: losartan (COZAAR) 100 MG tablet [[id number]] and amLODipine (NORVASC) 10 MG tablet [[id number]]

## 2015-08-04 NOTE — Telephone Encounter (Signed)
Left message for patient to call the office to schedule an OV with Dr. Drue Novel

## 2015-08-04 NOTE — Telephone Encounter (Signed)
Pt overdue for appt, last OV 12/2014, needed to F/U in 5 months. Needs appt before 12/2015.

## 2015-08-07 ENCOUNTER — Ambulatory Visit (INDEPENDENT_AMBULATORY_CARE_PROVIDER_SITE_OTHER): Payer: 59 | Admitting: Internal Medicine

## 2015-08-07 ENCOUNTER — Encounter: Payer: Self-pay | Admitting: Internal Medicine

## 2015-08-07 VITALS — BP 130/78 | HR 65 | Temp 97.8°F | Ht 76.0 in | Wt 214.2 lb

## 2015-08-07 DIAGNOSIS — I10 Essential (primary) hypertension: Secondary | ICD-10-CM

## 2015-08-07 DIAGNOSIS — Z23 Encounter for immunization: Secondary | ICD-10-CM

## 2015-08-07 DIAGNOSIS — R1032 Left lower quadrant pain: Secondary | ICD-10-CM | POA: Diagnosis not present

## 2015-08-07 MED ORDER — CARVEDILOL 12.5 MG PO TABS: 12.5000 mg | ORAL_TABLET | Freq: Two times a day (BID) | ORAL | Status: DC

## 2015-08-07 MED ORDER — LOSARTAN POTASSIUM 100 MG PO TABS: 100.0000 mg | ORAL_TABLET | Freq: Every day | ORAL | Status: DC

## 2015-08-07 MED ORDER — AMLODIPINE BESYLATE 10 MG PO TABS: 10.0000 mg | ORAL_TABLET | Freq: Every day | ORAL | Status: DC

## 2015-08-07 NOTE — Patient Instructions (Addendum)
GO TO THE FRONT DESK Schedule labs to be done 3 meeks from today (no need to be fasting)  See you in June

## 2015-08-07 NOTE — Progress Notes (Signed)
Subjective:    Patient ID: Christopher Ferrell, male    DOB: [birth year], 60 y.o.   MRN: [id number]  DOS:  08/07/2015 Type of visit - description : Routine office visit Interval history:  HTN: Run out of medications about a week ago otherwise good compliance without apparent side effects. ED: Viagra works without side effects Complaining of pain at the left groin, no mass, worse with certain movements.   Review of Systems  no chest pain or difficulty breathing No nausea, vomiting, diarrhea. Occasional cough with no sputum production  Past Medical History  Diagnosis Date  . Hypertension   . COPD (chronic obstructive pulmonary disease) [facility])     Past Surgical History  Procedure Laterality Date  . No past surgeries      Social History   Social History  . Marital Status: Unknown    Spouse Name: N/A  . Number of Children: 2  . Years of Education: N/A   Occupational History  . Furniture conservator/restorer    Social History Main Topics  . Smoking status: Current Every Day Smoker -- 0.75 packs/day    Types: Cigarettes  . Smokeless tobacco: Never Used     Comment: < 1/2 ppd   . Alcohol Use: 3.6 oz/week    6 Cans of beer per week  . Drug Use: No  . Sexual Activity: Not on file   Other Topics Concern  . Not on file   Social History Narrative   Single, lives w/ fiance no children    Moved to [place] ~ 2000 from [place]               Medication List       This list is accurate as of: 08/07/15  5:58 PM.  Always use your most recent med list.               amLODipine 10 MG tablet  Commonly known as:  NORVASC  Take 1 tablet (10 mg total) by mouth daily.     carvedilol 12.5 MG tablet  Commonly known as:  COREG  Take 1 tablet (12.5 mg total) by mouth 2 (two) times daily with a meal.     losartan 100 MG tablet  Commonly known as:  COZAAR  Take 1 tablet (100 mg total) by mouth daily.     sildenafil 100 MG tablet  Commonly known as:  VIAGRA  Take 1 tablet (100 mg total) by mouth  as needed for erectile dysfunction.           Objective:   Physical Exam BP 130/78 mmHg  Pulse 65  Temp(Src) 97.8 F (36.6 C) (Oral)  Ht  (1.93 m)  Wt 214 lb 4 oz (97.183 kg)  BMI 26.09 kg/m2  SpO2 96% General:   Well developed, well nourished . NAD.  HEENT:  Normocephalic . Face symmetric, atraumatic Lungs:  CTA B Normal respiratory effort, no intercostal retractions, no accessory muscle use. Heart: RRR,  no murmur.  No pretibial edema bilaterally  MSK: Hip rotation normal bilaterally GU: No inguinal hernias, scrotal contents normal. Skin: Not pale. Not jaundice Neurologic:  alert & oriented X3.  Speech normal, gait appropriate for age and unassisted Psych--  Cognition and judgment appear intact.  Cooperative with normal attention span and concentration.  Behavior appropriate. No anxious or depressed appearing.      Assessment & Plan:   Assessment HTN COPD--PFTs 08/2013 show mild obstruction of some response to bronchodilators Smoker  BPH, enlarge prostate,  asx  PLAN:  HTN: Refill medications, BMP in 3 weeks Groin pain: MSK? Hip arthritis? Recommend Tylenol, observation, call if symptoms increase RTC June 2017 for a CPX already scheduled.  Flu shot today

## 2015-08-07 NOTE — Progress Notes (Signed)
Pre visit review using our clinic review tool, if applicable. No additional management support is needed unless otherwise documented below in the visit note. 

## 2015-08-27 ENCOUNTER — Other Ambulatory Visit (INDEPENDENT_AMBULATORY_CARE_PROVIDER_SITE_OTHER): Payer: 59

## 2015-08-27 DIAGNOSIS — I10 Essential (primary) hypertension: Secondary | ICD-10-CM | POA: Diagnosis not present

## 2015-08-27 LAB — BASIC METABOLIC PANEL
BUN: 8 mg/dL (ref 6–23)
CALCIUM: 9.9 mg/dL (ref 8.4–10.5)
CO2: 30 mEq/L (ref 19–32)
CREATININE: 0.77 mg/dL (ref 0.40–1.50)
Chloride: 100 mEq/L (ref 96–112)
GFR: 109.68 mL/min (ref >60.00)
GLUCOSE: 112 mg/dL — AB (ref 70–99)
Potassium: 4.6 mEq/L (ref 3.5–5.1)
Sodium: 135 mEq/L (ref 135–145)

## 2015-12-03 ENCOUNTER — Encounter: Payer: Self-pay | Admitting: Internal Medicine

## 2015-12-03 ENCOUNTER — Ambulatory Visit (INDEPENDENT_AMBULATORY_CARE_PROVIDER_SITE_OTHER): Payer: 59 | Admitting: Internal Medicine

## 2015-12-03 ENCOUNTER — Ambulatory Visit (HOSPITAL_BASED_OUTPATIENT_CLINIC_OR_DEPARTMENT_OTHER)
Admission: RE | Admit: 2015-12-03 | Discharge: 2015-12-03 | Disposition: A | Discharge status: Home / Self Care | Payer: 59 | Source: Ambulatory Visit | Attending: Internal Medicine | Admitting: Internal Medicine

## 2015-12-03 VITALS — BP 134/76 | HR 71 | Temp 97.8°F | Ht 76.0 in | Wt 217.0 lb

## 2015-12-03 DIAGNOSIS — Z125 Encounter for screening for malignant neoplasm of prostate: Secondary | ICD-10-CM

## 2015-12-03 DIAGNOSIS — I6523 Occlusion and stenosis of bilateral carotid arteries: Secondary | ICD-10-CM | POA: Diagnosis not present

## 2015-12-03 DIAGNOSIS — J449 Chronic obstructive pulmonary disease, unspecified: Secondary | ICD-10-CM | POA: Diagnosis not present

## 2015-12-03 DIAGNOSIS — Z09 Encounter for follow-up examination after completed treatment for conditions other than malignant neoplasm: Secondary | ICD-10-CM | POA: Insufficient documentation

## 2015-12-03 DIAGNOSIS — R739 Hyperglycemia, unspecified: Secondary | ICD-10-CM

## 2015-12-03 DIAGNOSIS — M542 Cervicalgia: Secondary | ICD-10-CM | POA: Diagnosis not present

## 2015-12-03 DIAGNOSIS — Z0000 Encounter for general adult medical examination without abnormal findings: Secondary | ICD-10-CM | POA: Diagnosis not present

## 2015-12-03 DIAGNOSIS — M8938 Hypertrophy of bone, other site: Secondary | ICD-10-CM | POA: Diagnosis not present

## 2015-12-03 DIAGNOSIS — J3489 Other specified disorders of nose and nasal sinuses: Secondary | ICD-10-CM

## 2015-12-03 DIAGNOSIS — Z720 Tobacco use: Secondary | ICD-10-CM

## 2015-12-03 DIAGNOSIS — F17200 Nicotine dependence, unspecified, uncomplicated: Secondary | ICD-10-CM

## 2015-12-03 MED ORDER — CYCLOBENZAPRINE HCL 10 MG PO TABS: 10.0000 mg | ORAL_TABLET | Freq: Every evening | ORAL | Status: DC | PRN

## 2015-12-03 MED ORDER — PREDNISONE 10 MG PO TABS: ORAL_TABLET | ORAL | Status: DC

## 2015-12-03 NOTE — Assessment & Plan Note (Addendum)
Last TD 2013 ;  Pneumonia shot 2014; prevnar 2016 zostavax discussed before   Never had a cscope, 3 options pro-cons discussed, elected iFOB Prostate cancer screening: See BPH comments Labs: AST, ALT, FLP, A1c, PSA Counseled about diet, exercise.

## 2015-12-03 NOTE — Assessment & Plan Note (Signed)
HTN: Well-controlled, recent BMP satisfactory COPD: Nearly asx. Smoker: "not ready to quit" has a number of family issues, + stress. Advised to call me when ready BPH: Quite large prostate, asx. Check a PSA Groin pain: See previous visit, improving Neck pain: MSK? He is a smoker. Will check x-ray of the spine and chest. Trial with Tylenol, Flexeril and prednisone. Call if not better for referral RTC 6 months.

## 2015-12-03 NOTE — Assessment & Plan Note (Signed)
Dx w/ deviated septum 2014

## 2015-12-03 NOTE — Patient Instructions (Signed)
GO TO THE LAB : Get the blood work     GO TO THE FRONT DESK Schedule your next appointment for a  Check up in 6 months, no fasting      STOP BY THE FIRST FLOOR:  get the XR    For neck pain Prednisone as prescribed for 3 days Tylenol as needed Flexeril, a muscle relaxant at night If not better on the symptoms persist please call for a referral      Steps to Quit Smoking  Smoking tobacco can be harmful to your health and can affect almost every organ in your body. Smoking puts you, and those around you, at risk for developing many serious chronic diseases. Quitting smoking is difficult, but it is one of the best things that you can do for your health. It is never too late to quit. WHAT ARE THE BENEFITS OF QUITTING SMOKING? When you quit smoking, you lower your risk of developing serious diseases and conditions, such as:  Lung cancer or lung disease, such as COPD.  Heart disease.  Stroke.  Heart attack.  Infertility.  Osteoporosis and bone fractures. Additionally, symptoms such as coughing, wheezing, and shortness of breath may get better when you quit. You may also find that you get sick less often because your body is stronger at fighting off colds and infections. If you are pregnant, quitting smoking can help to reduce your chances of having a baby of low birth weight. HOW DO I GET READY TO QUIT? When you decide to quit smoking, create a plan to make sure that you are successful. Before you quit:  Pick a date to quit. Set a date within the next two weeks to give you time to prepare.  Write down the reasons why you are quitting. Keep this list in places where you will see it often, such as on your bathroom mirror or in your car or wallet.  Identify the people, places, things, and activities that make you want to smoke (triggers) and avoid them. Make sure to take these actions:  Throw away all cigarettes at home, at work, and in your car.  Throw away smoking  accessories, such as Set designerashtrays and lighters.  Clean your car and make sure to empty the ashtray.  Clean your home, including curtains and carpets.  Tell your family, friends, and coworkers that you are quitting. Support from your loved ones can make quitting easier.  Talk with your health care provider about your options for quitting smoking.  Find out what treatment options are covered by your health insurance. WHAT STRATEGIES CAN I USE TO QUIT SMOKING?  Talk with your healthcare provider about different strategies to quit smoking. Some strategies include:  Quitting smoking altogether instead of gradually lessening how much you smoke over a period of time. Research shows that quitting "cold [place]key" is more successful than gradually quitting.  Attending in-person counseling to help you build problem-solving skills. You are more likely to have success in quitting if you attend several counseling sessions. Even short sessions of 10 minutes can be effective.  Finding resources and support systems that can help you to quit smoking and remain smoke-free after you quit. These resources are most helpful when you use them often. They can include:  Online chats with a Veterinary surgeoncounselor.  Telephone quitlines.  Printed Materials engineerself-help materials.  Support groups or group counseling.  Text messaging programs.  Mobile phone applications.  Taking medicines to help you quit smoking. (If you are pregnant or breastfeeding,  talk with your health care provider first.) Some medicines contain nicotine and some do not. Both types of medicines help with cravings, but the medicines that include nicotine help to relieve withdrawal symptoms. Your health care provider may recommend:  Nicotine patches, gum, or lozenges.  Nicotine inhalers or sprays.  Non-nicotine medicine that is taken by mouth. Talk with your health care provider about combining strategies, such as taking medicines while you are also receiving in-person  counseling. Using these two strategies together makes you more likely to succeed in quitting than if you used either strategy on its own. If you are pregnant or breastfeeding, talk with your health care provider about finding counseling or other support strategies to quit smoking. Do not take medicine to help you quit smoking unless told to do so by your health care provider. WHAT THINGS CAN I DO TO MAKE IT EASIER TO QUIT? Quitting smoking might feel overwhelming at first, but there is a lot that you can do to make it easier. Take these important actions:  Reach out to your family and friends and ask that they support and encourage you during this time. Call telephone quitlines, reach out to support groups, or work with a counselor for support.  Ask people who smoke to avoid smoking around you.  Avoid places that trigger you to smoke, such as bars, parties, or smoke-break areas at work.  Spend time around people who do not smoke.  Lessen stress in your life, because stress can be a smoking trigger for some people. To lessen stress, try:  Exercising regularly.  Deep-breathing exercises.  Yoga.  Meditating.  Performing a body scan. This involves closing your eyes, scanning your body from head to toe, and noticing which parts of your body are particularly tense. Purposefully relax the muscles in those areas.  Download or purchase mobile phone or tablet apps (applications) that can help you stick to your quit plan by providing reminders, tips, and encouragement. There are many free apps, such as QuitGuide from the [employer] developer for Disease Control and Prevention). You can find other support for quitting smoking (smoking cessation) through smokefree.gov and other websites. HOW WILL I FEEL WHEN I QUIT SMOKING? Within the first 24 hours of quitting smoking, you may start to feel some withdrawal symptoms. These symptoms are usually most noticeable 2-3 days after quitting, but they usually do not  last beyond 2-3 weeks. Changes or symptoms that you might experience include:  Mood swings.  Restlessness, anxiety, or irritation.  Difficulty concentrating.  Dizziness.  Strong cravings for sugary foods in addition to nicotine.  Mild weight gain.  Constipation.  Nausea.  Coughing or a sore throat.  Changes in how your medicines work in your body.  A depressed mood.  Difficulty sleeping (insomnia). After the first 2-3 weeks of quitting, you may start to notice more positive results, such as:  Improved sense of smell and taste.  Decreased coughing and sore throat.  Slower heart rate.  Lower blood pressure.  Clearer skin.  The ability to breathe more easily.  Fewer sick days. Quitting smoking is very challenging for most people. Do not get discouraged if you are not successful the first time. Some people need to make many attempts to quit before they achieve long-term success. Do your best to stick to your quit plan, and talk with your health care provider if you have any questions or concerns.   This information is not intended to replace advice given to you by your health  care provider. Make sure you discuss any questions you have with your health care provider.   Document Released: 06/14/2001 Document Revised: 11/04/2014 Document Reviewed: 11/04/2014 Elsevier Interactive Patient Education [employer]

## 2015-12-03 NOTE — Progress Notes (Signed)
Subjective:    Patient ID: Christopher Ferrell, male    DOB: 27-Jul-1955, 60 y.o.   MRN: [id number]  DOS:  12/03/2015 Type of visit - description : CPX Interval history: HTN: Good compliance of medication, normal ambulatory BPs Still smoking  Review of Systems Constitutional: No fever. No chills. No unexplained wt changes. No unusual sweats  HEENT: No dental problems, no ear discharge, no facial swelling, no voice changes. No eye discharge, no eye  redness , no  intolerance to light   Respiratory: No wheezing , no  difficulty breathing. No cough , no mucus production  Cardiovascular: No CP, no leg swelling , no  Palpitations  GI: no nausea, no vomiting, no diarrhea , no  abdominal pain.  No blood in the stools. No dysphagia, no odynophagia    Endocrine: No polyphagia, no polyuria , no polydipsia  GU: No dysuria, gross hematuria, difficulty urinating. No urinary urgency, no frequency.  Musculoskeletal:  Few months history of pain located at the left trapezoid area, no radiation, no upper or lower extremity paresthesias, no injury. Pain comes every few weeks, may last 7 days, almost steady when present. Not necessarily worse with neck motion Was seen with pain at the groin, left side, improving. Skin: No change in the color of the skin, palor , no  Rash  Allergic, immunologic: No environmental allergies , no  food allergies  Neurological: No dizziness no  syncope. No headaches. No diplopia, no slurred, no slurred speech, no motor deficits, no facial  Numbness  Hematological: No enlarged lymph nodes, no easy bruising , no unusual bleedings  Psychiatry: No suicidal ideas, no hallucinations, no beavior problems, no confusion.  + Stress due to family issues but not persistent anxiety or depression   Past Medical History  Diagnosis Date  . Hypertension   . COPD (chronic obstructive pulmonary disease) [facility])     Past Surgical History  Procedure Laterality Date  . No past surgeries       Social History   Social History  . Marital Status: Unknown    Spouse Name: N/A  . Number of Children: 2  . Years of Education: N/A   Occupational History  . Furniture conservator/restorerroduction Manager    Social History Main Topics  . Smoking status: Current Every Day Smoker -- 0.75 packs/day    Types: Cigarettes  . Smokeless tobacco: Never Used     Comment: < 1/2 ppd   . Alcohol Use: 3.6 oz/week    6 Cans of beer per week  . Drug Use: No  . Sexual Activity: Not on file   Other Topics Concern  . Not on file   Social History Narrative   Single, lives w/ fiance no children , pt has 2 children from previous relationship   Moved to [place] ~ 2000 from [place]            Family History  Problem Relation Age of Onset  . CAD Neg Hx   . Stroke Neg Hx   . Diabetes Neg Hx   . Colon cancer Neg Hx   . Prostate cancer Neg Hx   . Rectal cancer Neg Hx   . Stomach cancer Neg Hx   . Cancer Father     type?       Medication List       This list is accurate as of: 12/03/15  5:23 PM.  Always use your most recent med list.  amLODipine 10 MG tablet  Commonly known as:  NORVASC  Take 1 tablet (10 mg total) by mouth daily.     carvedilol 12.5 MG tablet  Commonly known as:  COREG  Take 1 tablet (12.5 mg total) by mouth 2 (two) times daily with a meal.     cyclobenzaprine 10 MG tablet  Commonly known as:  FLEXERIL  Take 1 tablet (10 mg total) by mouth at bedtime as needed for muscle spasms.     losartan 100 MG tablet  Commonly known as:  COZAAR  Take 1 tablet (100 mg total) by mouth daily.     predniSONE 10 MG tablet  Commonly known as:  DELTASONE  4 tablets x 2 days, 3 tabs x 2 days, 2 tabs x 2 days, 1 tab x 2 days     sildenafil 100 MG tablet  Commonly known as:  VIAGRA  Take 1 tablet (100 mg total) by mouth as needed for erectile dysfunction.           Objective:   Physical Exam  Musculoskeletal:       Arms:  BP 134/76 mmHg  Pulse 71  Temp(Src) 97.8 F (36.6 C)  (Oral)  Ht  (1.93 m)  Wt 217 lb (98.431 kg)  BMI 26.43 kg/m2  SpO2 96%  General:   Well developed, well nourished . NAD.  Neck: No  thyromegaly . Full range of motion MSK: No TTP at the cervical spine. HEENT:  Normocephalic . Face symmetric, atraumatic Lungs:  CTA B Normal respiratory effort, no intercostal retractions, no accessory muscle use. Heart: RRR,  no murmur.  No pretibial edema bilaterally  Abdomen:  Not distended, soft, non-tender. No rebound or rigidity.   Skin: Exposed areas without rash. Not pale. Not jaundice Rectal:  External abnormalities: none. Normal sphincter tone. No rectal masses or tenderness.  No stools Prostate: Prostate gland firm and smooth, ++ enlargement, no nodularity, tenderness, mass, asymmetry or induration.  Neurologic:  alert & oriented X3.  Speech normal, gait appropriate for age and unassisted Strength symmetric and appropriate for age. DTRs symmetric Psych: Cognition and judgment appear intact.  Cooperative with normal attention span and concentration.  Behavior appropriate. No anxious or depressed appearing.    Assessment & Plan:  Assessment HTN COPD--PFTs 08/2013 show mild obstruction of some response to bronchodilators Smoker  BPH, enlarge prostate, asx  PLAN: HTN: Well-controlled, recent BMP satisfactory COPD: Nearly asx. Smoker: "not ready to quit" has a number of family issues, + stress. Advised to call me when ready BPH: Quite large prostate, asx. Check a PSA Groin pain: See previous visit, improving Neck pain: MSK? He is a smoker. Will check x-ray of the spine and chest. Trial with Tylenol, Flexeril and prednisone. Call if not better for referral RTC 6 months.

## 2015-12-03 NOTE — Progress Notes (Signed)
Pre visit review using our clinic review tool, if applicable. No additional management support is needed unless otherwise documented below in the visit note. 

## 2015-12-04 LAB — LIPID PANEL
CHOL/HDL RATIO: 3
Cholesterol: 145 mg/dL (ref 0–200)
HDL: 41.5 mg/dL (ref >39.00)
LDL Cholesterol: 85 mg/dL (ref 0–99)
NONHDL: 103.06
Triglycerides: 89 mg/dL (ref 0.0–149.0)
VLDL: 17.8 mg/dL (ref 0.0–40.0)

## 2015-12-04 LAB — ALT: ALT: 23 U/L (ref 0–53)

## 2015-12-04 LAB — PSA: PSA: 2.66 ng/mL (ref 0.10–4.00)

## 2015-12-04 LAB — HEMOGLOBIN A1C: HEMOGLOBIN A1C: 6.1 % (ref 4.6–6.5)

## 2015-12-04 LAB — AST: AST: 22 U/L (ref 0–37)

## 2015-12-06 ENCOUNTER — Telehealth: Payer: Self-pay | Admitting: Internal Medicine

## 2015-12-06 NOTE — Telephone Encounter (Signed)
Advise patient, blood work okay except for slightly increased blood sugar. Watch diet and stay active X-rays showed emphysema as expected, the neck x-ray showed DJD. If the neck pain is not improving he will let me know for further eval.

## 2015-12-07 NOTE — Telephone Encounter (Signed)
Spoke w/ Pt, informed him of results and instructed him to let us know if his neck pain is not improving. Pt verbalized understanding.

## 2015-12-07 NOTE — Telephone Encounter (Signed)
LMOM informing Pt to return call regarding results. 

## 2016-03-22 ENCOUNTER — Other Ambulatory Visit: Payer: Self-pay | Admitting: Internal Medicine

## 2016-06-03 ENCOUNTER — Ambulatory Visit: Payer: 59 | Admitting: Internal Medicine

## 2016-06-13 ENCOUNTER — Ambulatory Visit (INDEPENDENT_AMBULATORY_CARE_PROVIDER_SITE_OTHER): Payer: 59 | Admitting: Internal Medicine

## 2016-06-13 ENCOUNTER — Encounter: Payer: Self-pay | Admitting: Internal Medicine

## 2016-06-13 VITALS — BP 128/80 | HR 60 | Temp 97.8°F | Resp 14 | Ht 76.0 in | Wt 226.4 lb

## 2016-06-13 DIAGNOSIS — I10 Essential (primary) hypertension: Secondary | ICD-10-CM

## 2016-06-13 DIAGNOSIS — R7303 Prediabetes: Secondary | ICD-10-CM

## 2016-06-13 DIAGNOSIS — Z720 Tobacco use: Secondary | ICD-10-CM

## 2016-06-13 DIAGNOSIS — Z114 Encounter for screening for human immunodeficiency virus [HIV]: Secondary | ICD-10-CM

## 2016-06-13 DIAGNOSIS — Z1159 Encounter for screening for other viral diseases: Secondary | ICD-10-CM

## 2016-06-13 NOTE — Progress Notes (Signed)
Pre visit review using our clinic review tool, if applicable. No additional management support is needed unless otherwise documented below in the visit note. 

## 2016-06-13 NOTE — Progress Notes (Signed)
Subjective:    Patient ID: Christopher Ferrell, male    DOB: Jun 09, 1956, 60 y.o.   MRN: [id number]  DOS:  06/13/2016 Type of visit - description : rov Interval history: Recently dx with prediabetes. History of HTN: Good medication compliance, BP today is excellent Tobacco abuse: Still smoking, plans to quit after the end of the year.   Review of Systems   Past Medical History:  Diagnosis Date  . COPD (chronic obstructive pulmonary disease) (HCC)   . Hypertension     Past Surgical History:  Procedure Laterality Date  . NO PAST SURGERIES      Social History   Social History  . Marital status: Significant Other    Spouse name: N/A  . Number of children: 2  . Years of education: N/A   Occupational History  . Furniture conservator/restorerroduction Manager    Social History Main Topics  . Smoking status: Current Every Day Smoker    Packs/day: 0.75    Types: Cigarettes  . Smokeless tobacco: Never Used     Comment: < 1/2 ppd   . Alcohol use 3.6 oz/week    6 Cans of beer per week  . Drug use: No  . Sexual activity: Not on file   Other Topics Concern  . Not on file   Social History Narrative   Single, lives w/ fiance no children , pt has 2 children from previous relationship   Moved to [place] ~ 2000 from [place]               Medication List       Accurate as of 06/13/16 11:59 PM. Always use your most recent med list.          amLODipine 10 MG tablet Commonly known as:  NORVASC Take 1 tablet (10 mg total) by mouth daily.   carvedilol 12.5 MG tablet Commonly known as:  COREG Take 1 tablet (12.5 mg total) by mouth 2 (two) times daily with a meal.   losartan 100 MG tablet Commonly known as:  COZAAR Take 1 tablet (100 mg total) by mouth daily.   sildenafil 100 MG tablet Commonly known as:  VIAGRA Take 1 tablet (100 mg total) by mouth as needed for erectile dysfunction.          Objective:   Physical Exam BP 128/80 (BP Location: Left Arm, Patient Position: Sitting, Cuff Size:  Normal)   Pulse 60   Temp 97.8 F (36.6 C) (Oral)   Resp 14   Ht 6\' 4"  (1.93 m)   Wt 226 lb 6 oz (102.7 kg)   SpO2 98%   BMI 27.56 kg/m  General:   Well developed, well nourished . NAD.  HEENT:  Normocephalic . Face symmetric, atraumatic Skin: Not pale. Not jaundice Neurologic:  alert & oriented X3.  Speech normal, gait appropriate for age and unassisted Psych--  Cognition and judgment appear intact.  Cooperative with normal attention span and concentration.  Behavior appropriate. No anxious or depressed appearing.      Assessment & Plan:   Assessment Prediabetes: A1c 6.1  (12-03-15) HTN COPD--PFTs 08/2013 show mild obstruction of some response to bronchodilators Smoker  BPH, enlarge prostate, asx  PLAN: DM: Has been less active lately and gaining some weight.  D/w patient diet, exercise, also the concept of A1c. HTN: Continue amlodipine, carvedilol and losartan. Check a BMP. Smoker: plans to quit at the end of the year. Will call if help needed, Chantix? Wellbutrin? Primary care: Had a flu  shot, check a HIV and hep C. RTC 6 months, CPX

## 2016-06-13 NOTE — Patient Instructions (Signed)
GO TO THE LAB : Get the blood work     GO TO THE FRONT DESK Schedule your next appointment for a physical exam by June 2018   

## 2016-06-14 DIAGNOSIS — R739 Hyperglycemia, unspecified: Secondary | ICD-10-CM | POA: Insufficient documentation

## 2016-06-14 LAB — HEPATITIS C ANTIBODY: HCV AB: NEGATIVE

## 2016-06-14 LAB — BASIC METABOLIC PANEL
BUN: 8 mg/dL (ref 6–23)
CHLORIDE: 98 meq/L (ref 96–112)
CO2: 28 meq/L (ref 19–32)
CREATININE: 0.79 mg/dL (ref 0.40–1.50)
Calcium: 9.7 mg/dL (ref 8.4–10.5)
GFR: 106.2 mL/min (ref >60.00)
Glucose, Bld: 99 mg/dL (ref 70–99)
POTASSIUM: 4.4 meq/L (ref 3.5–5.1)
Sodium: 133 mEq/L — ABNORMAL LOW (ref 135–145)

## 2016-06-14 LAB — HIV ANTIBODY (ROUTINE TESTING W REFLEX): HIV 1&2 Ab, 4th Generation: NONREACTIVE

## 2016-06-14 NOTE — Assessment & Plan Note (Signed)
DM: Has been less active lately and gaining some weight.  D/w patient diet, exercise, also the concept of A1c. HTN: Continue amlodipine, carvedilol and losartan. Check a BMP. Smoker: plans to quit at the end of the year. Will call if help needed, Chantix? Wellbutrin? Primary care: Had a flu shot, check a HIV and hep C. RTC 6 months, CPX

## 2016-07-21 ENCOUNTER — Other Ambulatory Visit: Payer: Self-pay | Admitting: Internal Medicine

## 2016-10-27 ENCOUNTER — Other Ambulatory Visit: Payer: Self-pay | Admitting: Internal Medicine

## 2016-12-05 ENCOUNTER — Encounter: Payer: 59 | Admitting: Internal Medicine

## 2017-03-10 ENCOUNTER — Other Ambulatory Visit: Payer: Self-pay | Admitting: Internal Medicine

## 2017-03-15 ENCOUNTER — Encounter: Payer: Self-pay | Admitting: Internal Medicine

## 2017-03-15 ENCOUNTER — Ambulatory Visit (INDEPENDENT_AMBULATORY_CARE_PROVIDER_SITE_OTHER): Payer: BLUE CROSS/BLUE SHIELD | Admitting: Internal Medicine

## 2017-03-15 VITALS — BP 134/70 | HR 74 | Temp 98.0°F | Resp 14 | Ht 76.0 in | Wt 226.5 lb

## 2017-03-15 DIAGNOSIS — Z1211 Encounter for screening for malignant neoplasm of colon: Secondary | ICD-10-CM

## 2017-03-15 DIAGNOSIS — R7303 Prediabetes: Secondary | ICD-10-CM | POA: Diagnosis not present

## 2017-03-15 DIAGNOSIS — I10 Essential (primary) hypertension: Secondary | ICD-10-CM | POA: Diagnosis not present

## 2017-03-15 DIAGNOSIS — Z720 Tobacco use: Secondary | ICD-10-CM

## 2017-03-15 DIAGNOSIS — Z0000 Encounter for general adult medical examination without abnormal findings: Secondary | ICD-10-CM

## 2017-03-15 DIAGNOSIS — Z122 Encounter for screening for malignant neoplasm of respiratory organs: Secondary | ICD-10-CM

## 2017-03-15 DIAGNOSIS — G629 Polyneuropathy, unspecified: Secondary | ICD-10-CM

## 2017-03-15 DIAGNOSIS — J449 Chronic obstructive pulmonary disease, unspecified: Secondary | ICD-10-CM | POA: Diagnosis not present

## 2017-03-15 DIAGNOSIS — F17200 Nicotine dependence, unspecified, uncomplicated: Secondary | ICD-10-CM

## 2017-03-15 NOTE — Progress Notes (Signed)
Pre visit review using our clinic review tool, if applicable. No additional management support is needed unless otherwise documented below in the visit note. 

## 2017-03-15 NOTE — Assessment & Plan Note (Addendum)
-  Td 2013; Pneumonia shot 2014; prevnar 2016;  zostavax discussed before ; rec a flu shot  -CCS: Never had a cscope or other screening, request a GI referral -Prostate cancer screening: Quite large prostate, oligosymptomatic, recheck a PSA - Lung  cancer screening discussed: He smokes a pack a day since he was a teenager, currently half pack a day.(at least  34 pack/year tobacco), RX CT chest -Labs:  CMP, FLP, CBC, A1c, TSH, PSA, vitamin D, folic acid, B12, sedimentation rate --Counseled about diet, exercise.

## 2017-03-15 NOTE — Patient Instructions (Addendum)
    GO TO THE FRONT DESK Schedule labs to be non-fasting within the next few days  Schedule your next appointment for a  follow-up in 2 months

## 2017-03-15 NOTE — Progress Notes (Signed)
Subjective:    Patient ID: Christopher Ferrell, male    DOB: 10-Apr-1956, 61 y.o.   MRN: [id number]  DOS:  03/15/2017 Type of visit - description : cpx Interval history: Here for a CPX. In addition we managed his chronic medical problems and address multiple symptoms   Review of Systems -Reports cough, most mornings, occasional white sputum. No hemoptysis. Occasional wheezing. He continue smoking. -Occasional difficulty urinating -Reports tiredness for the last few months: When he does something working outside he has to stop because no energy. He does not experience shortness of breath, chest pain or arthralgias simply gets tired. On further questioning, he used to be much more active and now is only doing landscaping sporadically. -Reports bilateral feet burning at the end of the day where after he walked for several hours. Symptoms last about an hour. Also occasionally right leg numbness located at the anterior thigh. -Developed a lump on the right elbow 4 weeks ago, is not bothering him much, the area was never red, hot or tender to touch.  -Occasionally difficulty urinating but no dysuria or gross hematuria  Other than above, a 14 point review of systems is negative     Past Medical History:  Diagnosis Date  . COPD (chronic obstructive pulmonary disease) ([place])   . Hypertension     Past Surgical History:  Procedure Laterality Date  . NO PAST SURGERIES      Social History   Social History  . Marital status: Significant Other    Spouse name: N/A  . Number of children: 2  . Years of education: N/A   Occupational History  . Educational psychologist    Social History Main Topics  . Smoking status: Current Every Day Smoker    Packs/day: 0.75    Types: Cigarettes  . Smokeless tobacco: Never Used     Comment: ~ 1/2 ppd   . Alcohol use 3.6 oz/week    6 Cans of beer per week  . Drug use: No  . Sexual activity: Not on file   Other Topics Concern  . Not on file   Social  History Narrative   Single, lives w/ fiance, pt has 2 children from previous relationship   Moved to [place] ~ 2000 from [place]            Family History  Problem Relation Age of Onset  . Cancer Father        type?  . CAD Neg Hx   . Stroke Neg Hx   . Diabetes Neg Hx   . Colon cancer Neg Hx   . Prostate cancer Neg Hx   . Rectal cancer Neg Hx   . Stomach cancer Neg Hx      Allergies as of 03/15/2017   No Known Allergies     Medication List       Accurate as of 03/15/17 11:59 PM. Always use your most recent med list.          amLODipine 10 MG tablet Commonly known as:  NORVASC Take 1 tablet (10 mg total) by mouth daily.   carvedilol 12.5 MG tablet Commonly known as:  COREG Take 1 tablet (12.5 mg total) by mouth 2 (two) times daily with a meal.   losartan 100 MG tablet Commonly known as:  COZAAR Take 1 tablet (100 mg total) by mouth daily.   sildenafil 100 MG tablet Commonly known as:  VIAGRA Take 1 tablet (100 mg total) by mouth as needed for erectile  dysfunction.            Discharge Care Instructions        Start     Ordered   03/15/17 0000  Comp Met (CMET)     03/15/17 1612   03/15/17 0000  Lipid panel     03/15/17 1612   03/15/17 0000  CBC w/Diff     03/15/17 1612   03/15/17 0000  Hemoglobin A1c     03/15/17 1612   03/15/17 0000  TSH     03/15/17 1612   03/15/17 0000  PSA     03/15/17 1612   03/15/17 0000  Vitamin D 1,25 dihydroxy     03/15/17 1612   03/15/17 0000  B12     03/15/17 1612   03/15/17 0000  Folate     03/15/17 1612   03/15/17 0000  Sed Rate (ESR)     03/15/17 1612   03/15/17 0000  CT CHEST LUNG CA SCREEN LOW DOSE W/O CM    Question Answer Comment  Reason for Exam (SYMPTOM  OR DIAGNOSIS REQUIRED) lung cancer screening, current every day smoker   Preferred Imaging Location? [facility]   Preferred imaging location? [facility]   Radiology Contrast Protocol - do NOT remove file path  [email or web address]      03/15/17 1612   03/15/17 0000  Ambulatory referral to Gastroenterology    Question:  What is the reason for referral?  Answer:  Colonoscopy   03/15/17 1612         Objective:   Physical Exam  Musculoskeletal:       Arms:  BP 134/70 (BP Location: Left Arm, Patient Position: Sitting, Cuff Size: Normal)   Pulse 74   Temp 98 F (36.7 C) (Oral)   Resp 14   Ht 6' 4"  (1.93 m)   Wt 226 lb 8 oz (102.7 kg)   SpO2 97%   BMI 27.57 kg/m   General:   Well developed, well nourished . NAD.  Neck: No  thyromegaly  HEENT:  Normocephalic . Face symmetric, atraumatic Lungs:  Few rhonchi with cough, no wheezing. Normal respiratory effort, no intercostal retractions, no accessory muscle use. Heart: RRR,  no murmur.  Lower extremity: No edema Normal femoral and pedal pulses. Pinprick examination: Patchy decrease sensitivity to pinprick examination in a sock distribution. Abdomen:  Not distended, soft, non-tender. No rebound or rigidity.   Skin: Exposed areas without rash. Not pale. Not jaundice GU: Prostate quite enlarged, not nodular, hard or tender. Brown stools. Neurologic:  alert & oriented X3.  Speech normal, gait appropriate for age and unassisted Strength symmetric and appropriate for age. DTRs symmetric  Psych: Cognition and judgment appear intact.  Cooperative with normal attention span and concentration.  Behavior appropriate. No anxious or depressed appearing.    Assessment & Plan:   Assessment Prediabetes: A1c 6.1  (12-03-15) HTN COPD--PFTs 08/2013 show mild obstruction of some response to bronchodilators Smoker  BPH, enlarge prostate, asx  PLAN: Prediabetes: Recheck a A1c HTN: Continue amlodipine, carvedilol and losartan. Checking labs COPD, ongoing tobacco abuse: Mildly symptomatic, quitting tobacco is his best step to prevent further problems. D/w pt quitting options, encouraged self-education ( American Heart  Association website). Fatigue: As described above, no associated SOB, CP or arthralgias simply lack of energy. Checking general labs today, reassess in 2 months. Right elbow bursa enlargement: With no redness, fever or chills. Rec observation, is causing no discomfort Neuropathy: new issue. Burning of the feet likely  due to neuropathy, he also has some numbness at the anterior R thigh that sounds more like meralgia paresthetica. Neuropathy probably related to hyperglycemia but will check additional labs: L89, folic acid, sedimentation rate, vitamin D. RTC 2 months to reassess all of the above   Today, I spent more than 25   min with the patient: >50% of the time counseling regards to new diagnosis of neuropathy, educating him about quitting tobacco, managing chronic problems and addresing new symptoms.

## 2017-03-16 ENCOUNTER — Other Ambulatory Visit (INDEPENDENT_AMBULATORY_CARE_PROVIDER_SITE_OTHER): Payer: BLUE CROSS/BLUE SHIELD

## 2017-03-16 DIAGNOSIS — Z0000 Encounter for general adult medical examination without abnormal findings: Secondary | ICD-10-CM

## 2017-03-16 DIAGNOSIS — G629 Polyneuropathy, unspecified: Secondary | ICD-10-CM | POA: Insufficient documentation

## 2017-03-16 DIAGNOSIS — R5383 Other fatigue: Secondary | ICD-10-CM | POA: Diagnosis not present

## 2017-03-16 LAB — LIPID PANEL
CHOL/HDL RATIO: 3
Cholesterol: 151 mg/dL (ref 0–200)
HDL: 49.6 mg/dL (ref >39.00)
LDL CALC: 87 mg/dL (ref 0–99)
NONHDL: 101.86
Triglycerides: 72 mg/dL (ref 0.0–149.0)
VLDL: 14.4 mg/dL (ref 0.0–40.0)

## 2017-03-16 LAB — CBC WITH DIFFERENTIAL/PLATELET
BASOS ABS: 0.1 10*3/uL (ref 0.0–0.1)
Basophils Relative: 1.2 % (ref 0.0–3.0)
EOS ABS: 0.2 10*3/uL (ref 0.0–0.7)
EOS PCT: 2 % (ref 0.0–5.0)
HCT: 50 % (ref 39.0–52.0)
HEMOGLOBIN: 16.9 g/dL (ref 13.0–17.0)
LYMPHS ABS: 1.5 10*3/uL (ref 0.7–4.0)
Lymphocytes Relative: 20.8 % (ref 12.0–46.0)
MCHC: 33.8 g/dL (ref 30.0–36.0)
MCV: 98.1 fl (ref 78.0–100.0)
MONO ABS: 0.8 10*3/uL (ref 0.1–1.0)
Monocytes Relative: 10.3 % (ref 3.0–12.0)
NEUTROS PCT: 65.7 % (ref 43.0–77.0)
Neutro Abs: 4.9 10*3/uL (ref 1.4–7.7)
Platelets: 207 10*3/uL (ref 150.0–400.0)
RBC: 5.1 Mil/uL (ref 4.22–5.81)
RDW: 13.6 % (ref 11.5–15.5)
WBC: 7.4 10*3/uL (ref 4.0–10.5)

## 2017-03-16 LAB — COMPREHENSIVE METABOLIC PANEL
ALK PHOS: 77 U/L (ref 39–117)
ALT: 31 U/L (ref 0–53)
AST: 30 U/L (ref 0–37)
Albumin: 4.2 g/dL (ref 3.5–5.2)
BUN: 9 mg/dL (ref 6–23)
CO2: 28 meq/L (ref 19–32)
Calcium: 9.5 mg/dL (ref 8.4–10.5)
Chloride: 101 mEq/L (ref 96–112)
Creatinine, Ser: 0.85 mg/dL (ref 0.40–1.50)
GFR: 97.35 mL/min (ref >60.00)
GLUCOSE: 103 mg/dL — AB (ref 70–99)
POTASSIUM: 4.8 meq/L (ref 3.5–5.1)
SODIUM: 134 meq/L — AB (ref 135–145)
TOTAL PROTEIN: 7.3 g/dL (ref 6.0–8.3)
Total Bilirubin: 1 mg/dL (ref 0.2–1.2)

## 2017-03-16 LAB — TSH: TSH: 1.03 u[IU]/mL (ref 0.35–4.50)

## 2017-03-16 LAB — VITAMIN B12: VITAMIN B 12: 377 pg/mL (ref 211–911)

## 2017-03-16 LAB — HEMOGLOBIN A1C: HEMOGLOBIN A1C: 6.2 % (ref 4.6–6.5)

## 2017-03-16 LAB — FOLATE: FOLATE: 9.7 ng/mL (ref >5.9)

## 2017-03-16 LAB — PSA: PSA: 2.58 ng/mL (ref 0.10–4.00)

## 2017-03-16 LAB — SEDIMENTATION RATE: Sed Rate: 8 mm/hr (ref 0–20)

## 2017-03-16 NOTE — Assessment & Plan Note (Signed)
Prediabetes: Recheck a A1c HTN: Continue amlodipine, carvedilol and losartan. Checking labs COPD, ongoing tobacco abuse: Mildly symptomatic, quitting tobacco is his best step to prevent further problems. D/w pt quitting options, encouraged self-education ( American Heart Association website). Fatigue: As described above, no associated SOB, CP or arthralgias simply lack of energy. Checking general labs today, reassess in 2 months. Right elbow bursa enlargement: With no redness, fever or chills. Rec observation, is causing no discomfort Neuropathy: new issue. Burning of the feet likely due to neuropathy, he also has some numbness at the anterior R thigh that sounds more like meralgia paresthetica. Neuropathy probably related to hyperglycemia but will check additional labs: B12, folic acid, sedimentation rate, vitamin D. RTC 2 months to reassess all of the above

## 2017-03-17 ENCOUNTER — Ambulatory Visit (HOSPITAL_BASED_OUTPATIENT_CLINIC_OR_DEPARTMENT_OTHER)
Admission: RE | Admit: 2017-03-17 | Discharge: 2017-03-17 | Disposition: A | Discharge status: Home / Self Care | Payer: BLUE CROSS/BLUE SHIELD | Source: Ambulatory Visit | Attending: Internal Medicine | Admitting: Internal Medicine

## 2017-03-17 DIAGNOSIS — J439 Emphysema, unspecified: Secondary | ICD-10-CM | POA: Diagnosis not present

## 2017-03-17 DIAGNOSIS — I700 Atherosclerosis of aorta: Secondary | ICD-10-CM | POA: Insufficient documentation

## 2017-03-17 DIAGNOSIS — F17200 Nicotine dependence, unspecified, uncomplicated: Secondary | ICD-10-CM | POA: Diagnosis not present

## 2017-03-17 DIAGNOSIS — Z122 Encounter for screening for malignant neoplasm of respiratory organs: Secondary | ICD-10-CM | POA: Diagnosis not present

## 2017-03-17 DIAGNOSIS — F17210 Nicotine dependence, cigarettes, uncomplicated: Secondary | ICD-10-CM | POA: Diagnosis not present

## 2017-03-18 ENCOUNTER — Other Ambulatory Visit: Payer: Self-pay | Admitting: Internal Medicine

## 2017-03-19 LAB — VITAMIN D 1,25 DIHYDROXY
VITAMIN D 1, 25 (OH) TOTAL: 37 pg/mL (ref 18–72)
VITAMIN D3 1, 25 (OH): 37 pg/mL

## 2017-03-22 ENCOUNTER — Ambulatory Visit (HOSPITAL_BASED_OUTPATIENT_CLINIC_OR_DEPARTMENT_OTHER): Payer: BLUE CROSS/BLUE SHIELD

## 2017-04-17 ENCOUNTER — Other Ambulatory Visit: Payer: Self-pay | Admitting: Internal Medicine

## 2017-05-16 ENCOUNTER — Ambulatory Visit: Payer: BLUE CROSS/BLUE SHIELD | Admitting: Internal Medicine

## 2017-05-31 ENCOUNTER — Ambulatory Visit: Payer: BLUE CROSS/BLUE SHIELD | Admitting: Internal Medicine

## 2017-06-09 ENCOUNTER — Ambulatory Visit (INDEPENDENT_AMBULATORY_CARE_PROVIDER_SITE_OTHER): Payer: BLUE CROSS/BLUE SHIELD | Admitting: Internal Medicine

## 2017-06-09 ENCOUNTER — Encounter: Payer: Self-pay | Admitting: Internal Medicine

## 2017-06-09 VITALS — BP 126/78 | HR 76 | Temp 98.2°F | Resp 14 | Ht 76.0 in | Wt 227.4 lb

## 2017-06-09 DIAGNOSIS — R918 Other nonspecific abnormal finding of lung field: Secondary | ICD-10-CM | POA: Diagnosis not present

## 2017-06-09 DIAGNOSIS — G629 Polyneuropathy, unspecified: Secondary | ICD-10-CM

## 2017-06-09 DIAGNOSIS — I10 Essential (primary) hypertension: Secondary | ICD-10-CM | POA: Diagnosis not present

## 2017-06-09 DIAGNOSIS — Z720 Tobacco use: Secondary | ICD-10-CM

## 2017-06-09 DIAGNOSIS — Z23 Encounter for immunization: Secondary | ICD-10-CM | POA: Diagnosis not present

## 2017-06-09 NOTE — Progress Notes (Signed)
Subjective:    Patient ID: Christopher MiniumRobert C Ferrell, male    DOB: May 12, 1956, 61 y.o.   MRN: [id number]  DOS:  06/09/2017 Type of visit - description : f/u Interval history: Was seen in the last time with several concerns: Fatigue: He is actually improved, more energetic. Elbow bursa: Resolved Tobacco abuse: Has no quit but decrease significantly his smoking to less than 1/4 pack a day Neuropathy symptoms: They seem less intense Had an abnormal CT chest.  Wt Readings from Last 3 Encounters:  06/09/17 227 lb 6 oz (103.1 kg)  03/15/17 226 lb 8 oz (102.7 kg)  06/13/16 226 lb 6 oz (102.7 kg)     Review of Systems No difficulty breathing No persistent or recurrent cough No major stress or depression at this point.   Past Medical History:  Diagnosis Date  . COPD (chronic obstructive pulmonary disease) (HCC)   . Hypertension     Past Surgical History:  Procedure Laterality Date  . NO PAST SURGERIES      Social History   Socioeconomic History  . Marital status: Single    Spouse name: Not on file  . Number of children: 2  . Years of education: Not on file  . Highest education level: Not on file  Social Needs  . Financial resource strain: Not on file  . Food insecurity - worry: Not on file  . Food insecurity - inability: Not on file  . Transportation needs - medical: Not on file  . Transportation needs - non-medical: Not on file  Occupational History  . Occupation: Furniture conservator/restorerroduction Manager  Tobacco Use  . Smoking status: Current Every Day Smoker    Packs/day: 0.75    Types: Cigarettes  . Smokeless tobacco: Never Used  . Tobacco comment: ~ 1/2 ppd   Substance and Sexual Activity  . Alcohol use: Yes    Alcohol/week: 3.6 oz    Types: 6 Cans of beer per week  . Drug use: No  . Sexual activity: Not on file  Other Topics Concern  . Not on file  Social History Narrative   Single, lives w/ fiance, pt has 2 children from previous relationship   Moved to [place] ~ 2000 from [place]            Allergies as of 06/09/2017   No Known Allergies     Medication List        Accurate as of 06/09/17 11:59 PM. Always use your most recent med list.          amLODipine 10 MG tablet Commonly known as:  NORVASC Take 1 tablet (10 mg total) by mouth daily.   carvedilol 12.5 MG tablet Commonly known as:  COREG Take 1 tablet (12.5 mg total) by mouth 2 (two) times daily with a meal.   losartan 100 MG tablet Commonly known as:  COZAAR Take 1 tablet (100 mg total) by mouth daily.   sildenafil 100 MG tablet Commonly known as:  VIAGRA Take 1 tablet (100 mg total) by mouth as needed for erectile dysfunction.          Objective:   Physical Exam BP 126/78 (BP Location: Left Arm, Patient Position: Sitting, Cuff Size: Small)   Pulse 76   Temp 98.2 F (36.8 C) (Oral)   Resp 14   Ht 6\' 4"  (1.93 m)   Wt 227 lb 6 oz (103.1 kg)   SpO2 97%   BMI 27.68 kg/m  General:   Well developed, well nourished .  NAD.  HEENT:  Normocephalic . Face symmetric, atraumatic Neck: No LADs Lungs:  Decreased breath sounds clear Normal respiratory effort, no intercostal retractions, no accessory muscle use. Heart: RRR,  no murmur.  No pretibial edema bilaterally  Right elbow: Normal to inspection and palpation Skin: Not pale. Not jaundice Neurologic:  alert & oriented X3.  Speech normal, gait appropriate for age and unassisted Psych--  Cognition and judgment appear intact.  Cooperative with normal attention span and concentration.  Behavior appropriate. No anxious or depressed appearing.      Assessment & Plan:    Assessment Prediabetes: A1c 6.1  (12-03-15) HTN COPD--PFTs 08/2013 show mild obstruction of some response to bronchodilators Smoker  BPH, enlarge prostate, asx  PLAN: Seen w/ several concerns @ last visit, this is a follow-up: Tobacco abuse: Has decreased tobacco consumption to less than 1/4 pack a day.  Praised. Knows to call me if he ever decide to try a medication.   HTN: Continue to be well controlled Fatigue: Improved without any intervention except that he has been more active, eating healthier. Observation Right elbow bursitis: Resolved Neuropathy: Sxs decreased without intervention, recent labs negative.  For now commended observation. Consider neuro ref for further eval in the future  Abnormal CT chest: Report discussed with the patient, rechecking in few months. RTC 6 months

## 2017-06-09 NOTE — Progress Notes (Signed)
Pre visit review using our clinic review tool, if applicable. No additional management support is needed unless otherwise documented below in the visit note. 

## 2017-06-09 NOTE — Patient Instructions (Signed)
  GO TO THE FRONT DESK Schedule your next appointment for a check up in 6 months     

## 2017-06-11 DIAGNOSIS — R918 Other nonspecific abnormal finding of lung field: Secondary | ICD-10-CM | POA: Insufficient documentation

## 2017-06-11 NOTE — Assessment & Plan Note (Addendum)
Seen w/ several concerns @ last visit, this is a follow-up: Tobacco abuse: Has decreased tobacco consumption to less than 1/4 pack a day.  Praised. Knows to call me if he ever decide to try a medication.  HTN: Continue to be well controlled Fatigue: Improved without any intervention except that he has been more active, eating healthier. Observation Right elbow bursitis: Resolved Neuropathy: Sxs decreased without intervention, recent labs negative.  For now commended observation. Consider neuro ref for further eval in the future  Abnormal CT chest: Report discussed with the patient, rechecking in few months. RTC 6 months

## 2017-06-15 ENCOUNTER — Other Ambulatory Visit: Payer: Self-pay | Admitting: Internal Medicine

## 2017-06-19 ENCOUNTER — Encounter: Payer: Self-pay | Admitting: Internal Medicine

## 2017-07-25 ENCOUNTER — Other Ambulatory Visit: Payer: Self-pay | Admitting: Internal Medicine

## 2017-08-31 ENCOUNTER — Other Ambulatory Visit: Payer: Self-pay | Admitting: Internal Medicine

## 2017-09-21 ENCOUNTER — Telehealth: Payer: Self-pay | Admitting: Internal Medicine

## 2017-09-21 DIAGNOSIS — R918 Other nonspecific abnormal finding of lung field: Secondary | ICD-10-CM

## 2017-09-21 DIAGNOSIS — I272 Pulmonary hypertension, unspecified: Secondary | ICD-10-CM

## 2017-09-21 NOTE — Telephone Encounter (Signed)
Due for CT chest without contrast, DX pulmonary nodules Please arrange

## 2017-09-21 NOTE — Telephone Encounter (Signed)
Order placed

## 2017-09-29 ENCOUNTER — Ambulatory Visit (HOSPITAL_BASED_OUTPATIENT_CLINIC_OR_DEPARTMENT_OTHER): Payer: BLUE CROSS/BLUE SHIELD

## 2017-10-06 ENCOUNTER — Ambulatory Visit (HOSPITAL_BASED_OUTPATIENT_CLINIC_OR_DEPARTMENT_OTHER)
Admission: RE | Admit: 2017-10-06 | Discharge: 2017-10-06 | Disposition: A | Discharge status: Home / Self Care | Payer: BLUE CROSS/BLUE SHIELD | Source: Ambulatory Visit | Attending: Internal Medicine | Admitting: Internal Medicine

## 2017-10-06 DIAGNOSIS — R918 Other nonspecific abnormal finding of lung field: Secondary | ICD-10-CM | POA: Insufficient documentation

## 2017-10-06 DIAGNOSIS — I700 Atherosclerosis of aorta: Secondary | ICD-10-CM | POA: Diagnosis not present

## 2017-10-06 DIAGNOSIS — K760 Fatty (change of) liver, not elsewhere classified: Secondary | ICD-10-CM | POA: Diagnosis not present

## 2017-10-06 DIAGNOSIS — I2510 Atherosclerotic heart disease of native coronary artery without angina pectoris: Secondary | ICD-10-CM | POA: Insufficient documentation

## 2017-10-06 DIAGNOSIS — J439 Emphysema, unspecified: Secondary | ICD-10-CM | POA: Insufficient documentation

## 2017-10-09 NOTE — Addendum Note (Signed)
Addended byConrad Converse: Mackensie Pilson D on: 10/09/2017 11:56 AM   Modules accepted: Orders

## 2017-10-13 ENCOUNTER — Ambulatory Visit (HOSPITAL_COMMUNITY): Payer: BLUE CROSS/BLUE SHIELD | Attending: Cardiology

## 2017-10-13 ENCOUNTER — Other Ambulatory Visit: Payer: Self-pay

## 2017-10-13 DIAGNOSIS — I272 Pulmonary hypertension, unspecified: Secondary | ICD-10-CM | POA: Diagnosis not present

## 2017-10-13 DIAGNOSIS — Z720 Tobacco use: Secondary | ICD-10-CM | POA: Insufficient documentation

## 2017-10-13 DIAGNOSIS — I10 Essential (primary) hypertension: Secondary | ICD-10-CM | POA: Diagnosis not present

## 2017-10-13 DIAGNOSIS — J449 Chronic obstructive pulmonary disease, unspecified: Secondary | ICD-10-CM | POA: Insufficient documentation

## 2017-12-08 ENCOUNTER — Ambulatory Visit: Payer: BLUE CROSS/BLUE SHIELD | Admitting: Internal Medicine

## 2017-12-14 ENCOUNTER — Ambulatory Visit: Payer: BLUE CROSS/BLUE SHIELD | Admitting: Internal Medicine

## 2017-12-25 ENCOUNTER — Ambulatory Visit: Payer: BLUE CROSS/BLUE SHIELD | Admitting: Internal Medicine

## 2017-12-25 ENCOUNTER — Encounter: Payer: Self-pay | Admitting: Internal Medicine

## 2017-12-25 VITALS — BP 132/76 | HR 62 | Temp 97.5°F | Resp 16 | Ht 76.0 in | Wt 224.1 lb

## 2017-12-25 DIAGNOSIS — I10 Essential (primary) hypertension: Secondary | ICD-10-CM

## 2017-12-25 DIAGNOSIS — Z720 Tobacco use: Secondary | ICD-10-CM | POA: Diagnosis not present

## 2017-12-25 DIAGNOSIS — R739 Hyperglycemia, unspecified: Secondary | ICD-10-CM | POA: Diagnosis not present

## 2017-12-25 LAB — HEMOGLOBIN A1C: HEMOGLOBIN A1C: 6.2 % (ref 4.6–6.5)

## 2017-12-25 LAB — BASIC METABOLIC PANEL
BUN: 6 mg/dL (ref 6–23)
CALCIUM: 9.6 mg/dL (ref 8.4–10.5)
CHLORIDE: 96 meq/L (ref 96–112)
CO2: 27 mEq/L (ref 19–32)
CREATININE: 0.79 mg/dL (ref 0.40–1.50)
GFR: 105.66 mL/min (ref >60.00)
Glucose, Bld: 109 mg/dL — ABNORMAL HIGH (ref 70–99)
Potassium: 4.6 mEq/L (ref 3.5–5.1)
Sodium: 131 mEq/L — ABNORMAL LOW (ref 135–145)

## 2017-12-25 NOTE — Progress Notes (Signed)
Pre visit review using our clinic review tool, if applicable. No additional management support is needed unless otherwise documented below in the visit note. 

## 2017-12-25 NOTE — Progress Notes (Signed)
Subjective:    Patient ID: Christopher Ferrell, male    DOB: July 07, 1955, 62 y.o.   MRN: [id number]  DOS:  12/25/2017 Type of visit - description : Routine follow-up Interval history: Since the last office visit he is doing well, he is more active doing yard work. Medication compliance is very good. Still smokes, less than a pack a day.  Review of Systems Ambulatory BPs around 130/80. Denies chest pain, difficulty breathing.  Past Medical History:  Diagnosis Date  . COPD (chronic obstructive pulmonary disease) (HCC)   . Hypertension     Past Surgical History:  Procedure Laterality Date  . NO PAST SURGERIES      Social History   Socioeconomic History  . Marital status: Single    Spouse name: Not on file  . Number of children: 2  . Years of education: Not on file  . Highest education level: Not on file  Occupational History  . Occupation: Furniture conservator/restorerroduction Manager  Social Needs  . Financial resource strain: Not on file  . Food insecurity:    Worry: Not on file    Inability: Not on file  . Transportation needs:    Medical: Not on file    Non-medical: Not on file  Tobacco Use  . Smoking status: Current Every Day Smoker    Packs/day: 0.75    Types: Cigarettes  . Smokeless tobacco: Never Used  . Tobacco comment: ~ 1/2 ppd   Substance and Sexual Activity  . Alcohol use: Yes    Alcohol/week: 3.6 oz    Types: 6 Cans of beer per week  . Drug use: No  . Sexual activity: Not on file  Lifestyle  . Physical activity:    Days per week: Not on file    Minutes per session: Not on file  . Stress: Not on file  Relationships  . Social connections:    Talks on phone: Not on file    Gets together: Not on file    Attends religious service: Not on file    Active member of club or organization: Not on file    Attends meetings of clubs or organizations: Not on file    Relationship status: Not on file  . Intimate partner violence:    Fear of current or ex partner: Not on file   Emotionally abused: Not on file    Physically abused: Not on file    Forced sexual activity: Not on file  Other Topics Concern  . Not on file  Social History Narrative   Single, lives w/ fiance, pt has 2 children from previous relationship   Moved to [place] ~ 2000 from [place]          Allergies as of 12/25/2017   No Known Allergies     Medication List        Accurate as of 12/25/17 11:18 AM. Always use your most recent med list.          amLODipine 10 MG tablet Commonly known as:  NORVASC Take 1 tablet (10 mg total) by mouth daily.   carvedilol 12.5 MG tablet Commonly known as:  COREG Take 1 tablet (12.5 mg total) by mouth 2 (two) times daily with a meal.   losartan 100 MG tablet Commonly known as:  COZAAR Take 1 tablet (100 mg total) by mouth daily.   sildenafil 100 MG tablet Commonly known as:  VIAGRA Take 1 tablet (100 mg total) by mouth as needed for erectile dysfunction.  Objective:   Physical Exam BP 132/76 (BP Location: Left Arm, Patient Position: Sitting, Cuff Size: Normal)   Pulse 62   Temp (!) 97.5 F (36.4 C) (Oral)   Resp 16   Ht 6\' 4"  (1.93 m)   Wt 224 lb 2 oz (101.7 kg)   SpO2 97%   BMI 27.28 kg/m  General:   Well developed, NAD, see BMI.  HEENT:  Normocephalic . Face symmetric, atraumatic Lungs:  Decreased BS Normal respiratory effort, no intercostal retractions, no accessory muscle use. Heart: RRR,  no murmur.  No pretibial edema bilaterally  Skin: Not pale. Not jaundice Neurologic:  alert & oriented X3.  Speech normal, gait appropriate for age and unassisted Psych--  Cognition and judgment appear intact.  Cooperative with normal attention span and concentration.  Behavior appropriate. No anxious or depressed appearing.      Assessment & Plan:    Assessment Prediabetes: A1c 6.1  (12-03-15) HTN COPD--PFTs 08/2013 show mild obstruction of some response to bronchodilators Smoker  BPH, enlarge prostate,  asx  PLAN: Prediabetes: Diet controlled, he has been more active lately, recheck a A1c HTN: Seems well controlled on amlodipine, carvedilol, losartan.  Check a BMP Smoker: Again counseled, Wellbutrin?  Chantix?  Will call when ready. CT chest: Results reviewed, he has coronary atherosclerosis, plan is tocontrol CV RF's, quit tobacco RTC approximately 03-2018 CPX

## 2017-12-25 NOTE — Patient Instructions (Signed)
GO TO THE LAB : Get the blood work     GO TO THE FRONT DESK Schedule your next appointment for a   physical exam by September or October 2019

## 2017-12-26 NOTE — Assessment & Plan Note (Signed)
Prediabetes: Diet controlled, he has been more active lately, recheck a A1c HTN: Seems well controlled on amlodipine, carvedilol, losartan.  Check a BMP Smoker: Again counseled, Wellbutrin?  Chantix?  Will call when ready. CT chest: Results reviewed, he has coronary atherosclerosis, plan is tocontrol CV RF's, quit tobacco RTC approximately 03-2018 CPX

## 2018-02-05 ENCOUNTER — Other Ambulatory Visit: Payer: Self-pay | Admitting: Internal Medicine

## 2018-06-11 ENCOUNTER — Other Ambulatory Visit: Payer: Self-pay | Admitting: Internal Medicine

## 2018-06-18 ENCOUNTER — Encounter: Payer: Self-pay | Admitting: Internal Medicine

## 2018-06-18 ENCOUNTER — Ambulatory Visit (INDEPENDENT_AMBULATORY_CARE_PROVIDER_SITE_OTHER): Payer: BLUE CROSS/BLUE SHIELD | Admitting: Internal Medicine

## 2018-06-18 VITALS — BP 132/84 | HR 86 | Temp 98.6°F | Resp 16 | Ht 76.0 in | Wt 226.2 lb

## 2018-06-18 DIAGNOSIS — R972 Elevated prostate specific antigen [PSA]: Secondary | ICD-10-CM | POA: Diagnosis not present

## 2018-06-18 DIAGNOSIS — R739 Hyperglycemia, unspecified: Secondary | ICD-10-CM | POA: Diagnosis not present

## 2018-06-18 DIAGNOSIS — Z0000 Encounter for general adult medical examination without abnormal findings: Secondary | ICD-10-CM | POA: Diagnosis not present

## 2018-06-18 DIAGNOSIS — Z23 Encounter for immunization: Secondary | ICD-10-CM | POA: Diagnosis not present

## 2018-06-18 MED ORDER — SILDENAFIL CITRATE 20 MG PO TABS: 80.0000 mg | ORAL_TABLET | Freq: Every evening | ORAL | 3 refills | Status: AC | PRN

## 2018-06-18 NOTE — Progress Notes (Signed)
Subjective:    Patient ID: Christopher Ferrell, male    DOB: [birth year], 62 y.o.   MRN: [id number]  DOS:  06/18/2018 Type of visit - description : CPX  No major concerns  Review of Systems Hardly ever has any difficulty urinating.  Essentially no LUTS.  Other than above, a 14 point review of systems is negative    Past Medical History:  Diagnosis Date  . COPD (chronic obstructive pulmonary disease) (HCC)   . Hypertension     Past Surgical History:  Procedure Laterality Date  . NO PAST SURGERIES      Social History   Socioeconomic History  . Marital status: Single    Spouse name: Not on file  . Number of children: 2  . Years of education: Not on file  . Highest education level: Not on file  Occupational History  . Occupation: Furniture conservator/restorerroduction Manager  Social Needs  . Financial resource strain: Not on file  . Food insecurity:    Worry: Not on file    Inability: Not on file  . Transportation needs:    Medical: Not on file    Non-medical: Not on file  Tobacco Use  . Smoking status: Current Every Day Smoker    Packs/day: 0.75    Types: Cigarettes  . Smokeless tobacco: Never Used  . Tobacco comment: ~ 1/2 ppd   Substance and Sexual Activity  . Alcohol use: Yes    Alcohol/week: 6.0 standard drinks    Types: 6 Cans of beer per week  . Drug use: No  . Sexual activity: Not on file  Lifestyle  . Physical activity:    Days per week: Not on file    Minutes per session: Not on file  . Stress: Not on file  Relationships  . Social connections:    Talks on phone: Not on file    Gets together: Not on file    Attends religious service: Not on file    Active member of club or organization: Not on file    Attends meetings of clubs or organizations: Not on file    Relationship status: Not on file  . Intimate partner violence:    Fear of current or ex partner: Not on file    Emotionally abused: Not on file    Physically abused: Not on file    Forced sexual activity: Not on  file  Other Topics Concern  . Not on file  Social History Narrative   Single, lives w/ fiance, pt has 2 children from previous relationship   Moved to [place] ~ 2000 from [place]       Family History  Problem Relation Age of Onset  . Cancer Father        type?  . CAD Neg Hx   . Stroke Neg Hx   . Diabetes Neg Hx   . Colon cancer Neg Hx   . Prostate cancer Neg Hx   . Rectal cancer Neg Hx   . Stomach cancer Neg Hx      Allergies as of 06/18/2018   No Known Allergies     Medication List       Accurate as of June 18, 2018 11:59 PM. Always use your most recent med list.        amLODipine 10 MG tablet Commonly known as:  NORVASC Take 1 tablet (10 mg total) by mouth daily.   carvedilol 12.5 MG tablet Commonly known as:  COREG Take 1 tablet (12.5 mg  total) by mouth 2 (two) times daily with a meal.   losartan 100 MG tablet Commonly known as:  COZAAR Take 1 tablet (100 mg total) by mouth daily.   sildenafil 100 MG tablet Commonly known as:  VIAGRA Take 1 tablet (100 mg total) by mouth as needed for erectile dysfunction.   sildenafil 20 MG tablet Commonly known as:  REVATIO Take 4-5 tablets (80-100 mg total) by mouth at bedtime as needed.           Objective:   Physical Exam BP 132/84 (BP Location: Left Arm, Patient Position: Sitting, Cuff Size: Normal)   Pulse 86   Temp 98.6 F (37 C) (Oral)   Resp 16   Ht 6\' 4"  (1.93 m)   Wt 226 lb 4 oz (102.6 kg)   SpO2 98%   BMI 27.54 kg/m  General: Well developed, NAD, BMI noted Neck: No  thyromegaly  HEENT:  Normocephalic . Face symmetric, atraumatic Lungs:  Decreased BS B Normal respiratory effort, no intercostal retractions, no accessory muscle use. Heart: RRR,  no murmur.  No pretibial edema bilaterally  Abdomen:  Not distended, soft, non-tender. No rebound or rigidity.   Rectal: External abnormalities: none. Normal sphincter tone. No rectal masses or tenderness.  Brown stools Prostate: Prostate gland firm  and smooth.  Definitely enlarged but not tender or nodular. Skin: Exposed areas without rash. Not pale. Not jaundice Neurologic:  alert & oriented X3.  Speech normal, gait appropriate for age and unassisted Strength symmetric and appropriate for age.  Psych: Cognition and judgment appear intact.  Cooperative with normal attention span and concentration.  Behavior appropriate. No anxious or depressed appearing.     Assessment & Plan:    Assessment Prediabetes: A1c 6.1  (12-03-15) HTN Pulm --COPD--PFTs 08/2013 show mild obstruction of some response to bronchodilators --Smoker  --Pulmonary nodule ECHO: 4-20 19: Done for question of pulmonary hypertension on CT chest.  Essentially negative, grade 2 diastolic dysfunction noted. BPH, enlarge prostate, asx  PLAN: Prediabetes: A1c HTN: Continue with amlodipine, carvedilol, losartan.  Checking labs. COPD: Essentially no symptoms Smoker: We talk about quitting, will discussed meds, specifically Chantix.  Will call when ready for a prescription Pulmonary nodule: First noted on CT chest 03/17/2017, f/u CT was 10/06/2017, stable.  Next  CT 10-2018. RTC for a routine checkup 6 months

## 2018-06-18 NOTE — Patient Instructions (Signed)
   GO TO THE FRONT DESK Schedule your next appointment for a  Routine check in 6 months   Schedule labs to be done this week, fasting.  Come back in 2 or 3  months for Shingrix # 2.  Please make an appointment

## 2018-06-18 NOTE — Progress Notes (Signed)
Pre visit review using our clinic review tool, if applicable. No additional management support is needed unless otherwise documented below in the visit note. 

## 2018-06-18 NOTE — Assessment & Plan Note (Signed)
-  Td 2013; Pneumonia shot 2014; prevnar 2016;  zostavax discussed before ;   flu shot today; shingrix #1 today -CCS: previous  GI referral did not work for the patient.  We agreed on do an IFOB; he will think about Cologuard. -Prostate cancer screening: DRE today stable, check a PSA - Lung  cancer screening discussed: see comments under Pulm Nodule  -Labs: Will come back fasting for CMP, FLP, CBC, A1c, PSA -Diet and exercise discussed

## 2018-06-19 ENCOUNTER — Telehealth: Payer: Self-pay

## 2018-06-19 NOTE — Assessment & Plan Note (Signed)
Prediabetes: A1c HTN: Continue with amlodipine, carvedilol, losartan.  Checking labs. COPD: Essentially no symptoms Smoker: We talk about quitting, will discussed meds, specifically Chantix.  Will call when ready for a prescription Pulmonary nodule: First noted on CT chest 03/17/2017, f/u CT was 10/06/2017, stable.  Next  CT 10-2018. RTC for a routine checkup 6 months

## 2018-06-19 NOTE — Telephone Encounter (Signed)
PA denied. Revatio (sildenafil) 20mg  is covered for pulmonary HTN only.

## 2018-06-19 NOTE — Telephone Encounter (Signed)
PA initiated via Covermymeds; KEY: [id number]. Awaiting determination.

## 2018-06-20 ENCOUNTER — Other Ambulatory Visit (INDEPENDENT_AMBULATORY_CARE_PROVIDER_SITE_OTHER): Payer: BLUE CROSS/BLUE SHIELD

## 2018-06-20 DIAGNOSIS — Z0000 Encounter for general adult medical examination without abnormal findings: Secondary | ICD-10-CM

## 2018-06-20 DIAGNOSIS — R739 Hyperglycemia, unspecified: Secondary | ICD-10-CM | POA: Diagnosis not present

## 2018-06-20 DIAGNOSIS — R972 Elevated prostate specific antigen [PSA]: Secondary | ICD-10-CM | POA: Diagnosis not present

## 2018-06-20 LAB — CBC WITH DIFFERENTIAL/PLATELET
BASOS ABS: 0.1 10*3/uL (ref 0.0–0.1)
Basophils Relative: 1.1 % (ref 0.0–3.0)
Eosinophils Absolute: 0.2 10*3/uL (ref 0.0–0.7)
Eosinophils Relative: 2.9 % (ref 0.0–5.0)
HCT: 48 % (ref 39.0–52.0)
Hemoglobin: 16.9 g/dL (ref 13.0–17.0)
Lymphocytes Relative: 18.4 % (ref 12.0–46.0)
Lymphs Abs: 1.5 10*3/uL (ref 0.7–4.0)
MCHC: 35.2 g/dL (ref 30.0–36.0)
MCV: 96.6 fl (ref 78.0–100.0)
Monocytes Absolute: 0.9 10*3/uL (ref 0.1–1.0)
Monocytes Relative: 10.9 % (ref 3.0–12.0)
NEUTROS ABS: 5.3 10*3/uL (ref 1.4–7.7)
Neutrophils Relative %: 66.7 % (ref 43.0–77.0)
PLATELETS: 189 10*3/uL (ref 150.0–400.0)
RBC: 4.97 Mil/uL (ref 4.22–5.81)
RDW: 13.2 % (ref 11.5–15.5)
WBC: 7.9 10*3/uL (ref 4.0–10.5)

## 2018-06-20 LAB — COMPREHENSIVE METABOLIC PANEL
ALT: 25 U/L (ref 0–53)
AST: 25 U/L (ref 0–37)
Albumin: 4.1 g/dL (ref 3.5–5.2)
Alkaline Phosphatase: 80 U/L (ref 39–117)
BUN: 5 mg/dL — ABNORMAL LOW (ref 6–23)
CO2: 27 meq/L (ref 19–32)
Calcium: 9 mg/dL (ref 8.4–10.5)
Chloride: 96 mEq/L (ref 96–112)
Creatinine, Ser: 0.73 mg/dL (ref 0.40–1.50)
GFR: 115.56 mL/min (ref >60.00)
GLUCOSE: 87 mg/dL (ref 70–99)
Potassium: 4.6 mEq/L (ref 3.5–5.1)
Sodium: 131 mEq/L — ABNORMAL LOW (ref 135–145)
Total Bilirubin: 0.9 mg/dL (ref 0.2–1.2)
Total Protein: 7.2 g/dL (ref 6.0–8.3)

## 2018-06-20 LAB — LIPID PANEL
Cholesterol: 131 mg/dL (ref 0–200)
HDL: 39.8 mg/dL (ref >39.00)
LDL Cholesterol: 71 mg/dL (ref 0–99)
NonHDL: 91.41
Total CHOL/HDL Ratio: 3
Triglycerides: 102 mg/dL (ref 0.0–149.0)
VLDL: 20.4 mg/dL (ref 0.0–40.0)

## 2018-06-20 LAB — PSA: PSA: 3.22 ng/mL (ref 0.10–4.00)

## 2018-06-20 LAB — HEMOGLOBIN A1C: Hgb A1c MFr Bld: 6.1 % (ref 4.6–6.5)

## 2018-07-03 ENCOUNTER — Other Ambulatory Visit: Payer: Self-pay | Admitting: Internal Medicine

## 2018-08-09 ENCOUNTER — Other Ambulatory Visit: Payer: Self-pay | Admitting: Internal Medicine

## 2018-08-22 ENCOUNTER — Ambulatory Visit (INDEPENDENT_AMBULATORY_CARE_PROVIDER_SITE_OTHER): Payer: BLUE CROSS/BLUE SHIELD | Admitting: *Deleted

## 2018-08-22 DIAGNOSIS — Z23 Encounter for immunization: Secondary | ICD-10-CM | POA: Diagnosis not present

## 2018-08-22 NOTE — Progress Notes (Signed)
Patient in today for 2nd shingles vaccine  Vaccine given and patient tolerated well. 

## 2018-09-15 ENCOUNTER — Other Ambulatory Visit: Payer: Self-pay | Admitting: Internal Medicine

## 2018-12-18 ENCOUNTER — Ambulatory Visit (INDEPENDENT_AMBULATORY_CARE_PROVIDER_SITE_OTHER): Payer: BC Managed Care – PPO | Admitting: Internal Medicine

## 2018-12-18 ENCOUNTER — Encounter: Payer: Self-pay | Admitting: Internal Medicine

## 2018-12-18 ENCOUNTER — Other Ambulatory Visit: Payer: Self-pay

## 2018-12-18 VITALS — BP 152/56 | HR 58 | Temp 97.8°F | Resp 16 | Ht 76.0 in | Wt 221.1 lb

## 2018-12-18 DIAGNOSIS — R972 Elevated prostate specific antigen [PSA]: Secondary | ICD-10-CM | POA: Diagnosis not present

## 2018-12-18 DIAGNOSIS — Z122 Encounter for screening for malignant neoplasm of respiratory organs: Secondary | ICD-10-CM

## 2018-12-18 DIAGNOSIS — I10 Essential (primary) hypertension: Secondary | ICD-10-CM | POA: Diagnosis not present

## 2018-12-18 DIAGNOSIS — Z720 Tobacco use: Secondary | ICD-10-CM | POA: Diagnosis not present

## 2018-12-18 LAB — BASIC METABOLIC PANEL
BUN: 5 mg/dL — ABNORMAL LOW (ref 6–23)
CO2: 26 mEq/L (ref 19–32)
Calcium: 9.2 mg/dL (ref 8.4–10.5)
Chloride: 97 mEq/L (ref 96–112)
Creatinine, Ser: 0.75 mg/dL (ref 0.40–1.50)
GFR: 105.22 mL/min (ref >60.00)
Glucose, Bld: 105 mg/dL — ABNORMAL HIGH (ref 70–99)
Potassium: 4.3 mEq/L (ref 3.5–5.1)
Sodium: 130 mEq/L — ABNORMAL LOW (ref 135–145)

## 2018-12-18 LAB — PSA: PSA: 2.52 ng/mL (ref 0.10–4.00)

## 2018-12-18 NOTE — Assessment & Plan Note (Signed)
HTN: On amlodipine, carvedilol, losartan.  BP is slightly elevated today, recommend to check ambulatory BPs at home.  Check a BMP.  BP goals discussed. COPD: No symptoms, discussed COVID-19 precautions and tobacco cessation. Lung cancer screening: Due for a CT, will arrange. BPH: Essentially no symptoms, last PSA slightly elevated, increased PSA velocity?Marland Kitchen  Check a PSA RTC 6 months CPX

## 2018-12-18 NOTE — Progress Notes (Signed)
Subjective:    Patient ID: Christopher Ferrell, male    DOB: Dec 01, 1955, 63 y.o.   MRN: [id number]  DOS:  12/18/2018 Type of visit - description: Routine office visit COVID-19: Following all the precautions, mild to moderately anxious about it however he is back working he feels somewhat better HTN: Good med compliance, BP today slightly elevated Still smoking, about half pack a day.   BP Readings from Last 3 Encounters:  12/18/18 (!) 152/56  06/18/18 132/84  12/25/17 132/76   Wt Readings from Last 3 Encounters:  12/18/18 221 lb 2 oz (100.3 kg)  06/18/18 226 lb 4 oz (102.6 kg)  12/25/17 224 lb 2 oz (101.7 kg)      Review of Systems Mild decrease in appetite, has lost 5 pounds, denies feeling depressed per se.  No nausea, vomiting, diarrhea No chest pain no difficulty breathing.  No cough or wheezing Occasional difficulty urinating but no urinary frequency or blood in the urine.   Past Medical History:  Diagnosis Date  . COPD (chronic obstructive pulmonary disease) (HCC)   . Hypertension     Past Surgical History:  Procedure Laterality Date  . NO PAST SURGERIES      Social History   Socioeconomic History  . Marital status: Single    Spouse name: Not on file  . Number of children: 2  . Years of education: Not on file  . Highest education level: Not on file  Occupational History  . Occupation: Furniture conservator/restorerroduction Manager  Social Needs  . Financial resource strain: Not on file  . Food insecurity    Worry: Not on file    Inability: Not on file  . Transportation needs    Medical: Not on file    Non-medical: Not on file  Tobacco Use  . Smoking status: Current Every Day Smoker    Packs/day: 0.75    Types: Cigarettes  . Smokeless tobacco: Never Used  . Tobacco comment: ~ 1/2 ppd   Substance and Sexual Activity  . Alcohol use: Yes    Alcohol/week: 6.0 standard drinks    Types: 6 Cans of beer per week  . Drug use: No  . Sexual activity: Not on file  Lifestyle  .  Physical activity    Days per week: Not on file    Minutes per session: Not on file  . Stress: Not on file  Relationships  . Social Musicianconnections    Talks on phone: Not on file    Gets together: Not on file    Attends religious service: Not on file    Active member of club or organization: Not on file    Attends meetings of clubs or organizations: Not on file    Relationship status: Not on file  . Intimate partner violence    Fear of current or ex partner: Not on file    Emotionally abused: Not on file    Physically abused: Not on file    Forced sexual activity: Not on file  Other Topics Concern  . Not on file  Social History Narrative   Single, lives w/ fiance, pt has 2 children from previous relationship   Moved to [place] ~ 2000 from [place]          Allergies as of 12/18/2018   No Known Allergies     Medication List       Accurate as of December 18, 2018  1:01 PM. If you have any questions, ask your nurse or doctor.  STOP taking these medications   sildenafil 100 MG tablet Commonly known as: Viagra Stopped by: Kathlene November, MD     TAKE these medications   amLODipine 10 MG tablet Commonly known as: NORVASC Take 1 tablet (10 mg total) by mouth daily.   carvedilol 12.5 MG tablet Commonly known as: COREG Take 1 tablet (12.5 mg total) by mouth 2 (two) times daily with a meal.   losartan 100 MG tablet Commonly known as: COZAAR Take 1 tablet (100 mg total) by mouth daily.   sildenafil 20 MG tablet Commonly known as: REVATIO Take 4-5 tablets (80-100 mg total) by mouth at bedtime as needed.           Objective:   Physical Exam BP (!) 152/56 (BP Location: Left Arm, Patient Position: Sitting, Cuff Size: Normal)   Pulse (!) 58   Temp 97.8 F (36.6 C) (Oral)   Resp 16   Ht 6\' 4"  (1.93 m)   Wt 221 lb 2 oz (100.3 kg)   SpO2 100%   BMI 26.92 kg/m   General:   Well developed, NAD, BMI noted.  HEENT:  Normocephalic . Face symmetric, atraumatic Lungs:  Decreased  breath sounds otherwise clear Normal respiratory effort, no intercostal retractions, no accessory muscle use. Heart: RRR,  no murmur.  no pretibial edema bilaterally  Abdomen:  Not distended, soft, non-tender. No rebound or rigidity.   Skin: Not pale. Not jaundice Neurologic:  alert & oriented X3.  Speech normal, gait appropriate for age and unassisted Psych--  Cognition and judgment appear intact.  Cooperative with normal attention span and concentration.  Behavior appropriate. No anxious or depressed appearing.     Assessment     Assessment Prediabetes: A1c 6.1  (12-03-15) HTN Pulm --COPD--PFTs 08/2013 show mild obstruction of some response to bronchodilators --Smoker  --Pulmonary nodule ECHO: 4-20 19: Done for question of pulmonary hypertension on CT chest.  Essentially negative, grade 2 diastolic dysfunction noted. BPH, enlarge prostate, asx  PLAN: HTN: On amlodipine, carvedilol, losartan.  BP is slightly elevated today, recommend to check ambulatory BPs at home.  Check a BMP.  BP goals discussed. COPD: No symptoms, discussed COVID-19 precautions and tobacco cessation. Lung cancer screening: Due for a CT, will arrange. BPH: Essentially no symptoms, last PSA slightly elevated, increased PSA velocity?Marland Kitchen  Check a PSA RTC 6 months CPX

## 2018-12-18 NOTE — Patient Instructions (Addendum)
Get the blood work     [facility] Schedule your next appointment       Get a flu shot  Check the  blood pressure   weekly   GOAL is between 110/65 and  135/85. If it is consistently higher or lower, let me know

## 2018-12-25 ENCOUNTER — Ambulatory Visit (HOSPITAL_BASED_OUTPATIENT_CLINIC_OR_DEPARTMENT_OTHER): Payer: BC Managed Care – PPO

## 2019-02-01 ENCOUNTER — Other Ambulatory Visit: Payer: Self-pay | Admitting: Internal Medicine

## 2019-02-28 ENCOUNTER — Other Ambulatory Visit: Payer: Self-pay | Admitting: Internal Medicine

## 2019-04-10 ENCOUNTER — Other Ambulatory Visit: Payer: Self-pay | Admitting: Internal Medicine

## 2019-04-15 ENCOUNTER — Ambulatory Visit: Payer: BC Managed Care – PPO | Admitting: Internal Medicine

## 2019-04-15 ENCOUNTER — Other Ambulatory Visit: Payer: Self-pay

## 2019-04-15 ENCOUNTER — Encounter: Payer: Self-pay | Admitting: Internal Medicine

## 2019-04-15 VITALS — BP 173/69 | HR 61 | Temp 97.4°F | Resp 16 | Ht 76.0 in | Wt 217.0 lb

## 2019-04-15 DIAGNOSIS — Z23 Encounter for immunization: Secondary | ICD-10-CM

## 2019-04-15 DIAGNOSIS — M549 Dorsalgia, unspecified: Secondary | ICD-10-CM

## 2019-04-15 MED ORDER — CYCLOBENZAPRINE HCL 10 MG PO TABS: 10.0000 mg | ORAL_TABLET | Freq: Two times a day (BID) | ORAL | 0 refills | Status: DC | PRN

## 2019-04-15 NOTE — Patient Instructions (Signed)
Warm compress  Tylenol  500 mg OTC 2 tabs a day every 8 hours as needed for pain  Flexeril ( a muscle relaxant) as needed.  May cause drowsiness  Call if no better in few days

## 2019-04-15 NOTE — Progress Notes (Signed)
Subjective:    Patient ID: Christopher Ferrell, male    DOB: [birth year], 63 y.o.   MRN: [id number]  DOS:  04/15/2019 Type of visit - description: Acute Symptoms started 4 days ago, sudden onset, pain located at the left upper back and distal neck.  Currently the pain is only located on the left upper back. Described as sharp, throbbing, on and off, decreased with  certain positions. No obvious triggers.  No injuries. In the past had a similar episode, a muscle relaxant helped. He has been taking ibuprofen   Review of Systems No fever chills No rash No upper or lower extremity paresthesias No radiation No change with breathing Cough at baseline.  Past Medical History:  Diagnosis Date  . COPD (chronic obstructive pulmonary disease) (HCC)   . Hypertension     Past Surgical History:  Procedure Laterality Date  . NO PAST SURGERIES      Social History   Socioeconomic History  . Marital status: Single    Spouse name: Not on file  . Number of children: 2  . Years of education: Not on file  . Highest education level: Not on file  Occupational History  . Occupation: Furniture conservator/restorer  Social Needs  . Financial resource strain: Not on file  . Food insecurity    Worry: Not on file    Inability: Not on file  . Transportation needs    Medical: Not on file    Non-medical: Not on file  Tobacco Use  . Smoking status: Current Every Day Smoker    Packs/day: 0.75    Types: Cigarettes  . Smokeless tobacco: Never Used  . Tobacco comment: ~ 1/2 ppd   Substance and Sexual Activity  . Alcohol use: Yes    Alcohol/week: 6.0 standard drinks    Types: 6 Cans of beer per week  . Drug use: No  . Sexual activity: Not on file  Lifestyle  . Physical activity    Days per week: Not on file    Minutes per session: Not on file  . Stress: Not on file  Relationships  . Social Musician on phone: Not on file    Gets together: Not on file    Attends religious service: Not on  file    Active member of club or organization: Not on file    Attends meetings of clubs or organizations: Not on file    Relationship status: Not on file  . Intimate partner violence    Fear of current or ex partner: Not on file    Emotionally abused: Not on file    Physically abused: Not on file    Forced sexual activity: Not on file  Other Topics Concern  . Not on file  Social History Narrative   Single, lives w/ fiance, pt has 2 children from previous relationship   Moved to [place] ~ 2000 from [place]          Allergies as of 04/15/2019   No Known Allergies     Medication List       Accurate as of April 15, 2019 10:38 AM. If you have any questions, ask your nurse or doctor.        amLODipine 10 MG tablet Commonly known as: NORVASC Take 1 tablet (10 mg total) by mouth daily.   carvedilol 12.5 MG tablet Commonly known as: COREG Take 1 tablet (12.5 mg total) by mouth 2 (two) times daily with a meal.  losartan 100 MG tablet Commonly known as: COZAAR Take 1 tablet (100 mg total) by mouth daily.   sildenafil 20 MG tablet Commonly known as: REVATIO Take 4-5 tablets (80-100 mg total) by mouth at bedtime as needed.           Objective:   Physical Exam Musculoskeletal:       Arms:    BP (!) 173/69 (BP Location: Left Arm, Patient Position: Sitting, Cuff Size: Normal)   Pulse 61   Temp (!) 97.4 F (36.3 C) (Temporal)   Resp 16   Ht 6\' 4"  (1.93 m)   Wt 217 lb (98.4 kg)   SpO2 100%   BMI 26.41 kg/m    General:   Well developed, NAD, BMI noted. HEENT:  Normocephalic . Face symmetric, atraumatic Neck: No TTP at the cervical spine No unusual lymph nodes or mass at the neck or supraclavicular areas Lungs:  CTA B Normal respiratory effort, no intercostal retractions, no accessory muscle use. Heart: RRR,  no murmur.  No pretibial edema bilaterally  Skin: Not pale. Not jaundice Neurologic:  alert & oriented X3.  Speech normal, gait appropriate for age and  unassisted. Motor and DTRs symmetric Psych--  Cognition and judgment appear intact.  Cooperative with normal attention span and concentration.  Behavior appropriate. No anxious or depressed appearing.      Assessment      Assessment Prediabetes: A1c 6.1  (12-03-15) HTN Pulm --COPD--PFTs 08/2013 show mild obstruction of some response to bronchodilators --Smoker  --Pulmonary nodule ECHO: 4-20 19: Done for question of pulmonary hypertension on CT chest.  Essentially negative, grade 2 diastolic dysfunction noted. BPH, enlarge prostate, asx  PLAN: Upper back pain: Likely trapezoid strain.  No clear triggers.  Never exam normal. We will treat conservatively with Tylenol rather than  NSAIDs. Also Flexeril, watch for drowsiness Warm compress and call if not improving. HTN: BP was slightly elevated upon arrival, recheck 140/80.  No change. Preventive care: Flu shot today

## 2019-04-15 NOTE — Progress Notes (Signed)
Pre visit review using our clinic review tool, if applicable. No additional management support is needed unless otherwise documented below in the visit note. 

## 2019-04-15 NOTE — Progress Notes (Signed)
Below neck on L Sharp throbbing pain On and off Laying down turning neck relieves pain Nothing makes it worse Took otc pain medication twice, helped a bit Feels better when he pushes on it  No recent trauma, heavy lifting  Has had similar pain in the past Was given muscle relaxor by Dr. Larose Kells and it went away   No numbness/tingling, weakness in arms or legs, headaches, visual changes

## 2019-04-16 NOTE — Assessment & Plan Note (Signed)
Upper back pain: Likely trapezoid strain.  No clear triggers.  Never exam normal. We will treat conservatively with Tylenol rather than  NSAIDs. Also Flexeril, watch for drowsiness Warm compress and call if not improving. HTN: BP was slightly elevated upon arrival, recheck 140/80.  No change. Preventive care: Flu shot today

## 2019-06-20 ENCOUNTER — Encounter: Payer: Self-pay | Admitting: Internal Medicine

## 2019-06-20 ENCOUNTER — Other Ambulatory Visit: Payer: Self-pay

## 2019-06-20 ENCOUNTER — Ambulatory Visit (INDEPENDENT_AMBULATORY_CARE_PROVIDER_SITE_OTHER): Payer: BC Managed Care – PPO | Admitting: Internal Medicine

## 2019-06-20 VITALS — BP 170/74 | HR 59 | Temp 96.6°F | Resp 16 | Ht 76.0 in | Wt 217.0 lb

## 2019-06-20 DIAGNOSIS — Z0000 Encounter for general adult medical examination without abnormal findings: Secondary | ICD-10-CM

## 2019-06-20 DIAGNOSIS — R918 Other nonspecific abnormal finding of lung field: Secondary | ICD-10-CM

## 2019-06-20 DIAGNOSIS — E119 Type 2 diabetes mellitus without complications: Secondary | ICD-10-CM

## 2019-06-20 LAB — CBC
HCT: 51.2 % (ref 39.0–52.0)
Hemoglobin: 17.5 g/dL — ABNORMAL HIGH (ref 13.0–17.0)
MCHC: 34.1 g/dL (ref 30.0–36.0)
MCV: 98.5 fl (ref 78.0–100.0)
Platelets: 210 10*3/uL (ref 150.0–400.0)
RBC: 5.2 Mil/uL (ref 4.22–5.81)
RDW: 12.9 % (ref 11.5–15.5)
WBC: 8.6 10*3/uL (ref 4.0–10.5)

## 2019-06-20 LAB — COMPREHENSIVE METABOLIC PANEL
ALT: 22 U/L (ref 0–53)
AST: 27 U/L (ref 0–37)
Albumin: 4.3 g/dL (ref 3.5–5.2)
Alkaline Phosphatase: 79 U/L (ref 39–117)
BUN: 5 mg/dL — ABNORMAL LOW (ref 6–23)
CO2: 29 mEq/L (ref 19–32)
Calcium: 9.7 mg/dL (ref 8.4–10.5)
Chloride: 94 mEq/L — ABNORMAL LOW (ref 96–112)
Creatinine, Ser: 0.71 mg/dL (ref 0.40–1.50)
GFR: 111.9 mL/min (ref >60.00)
Glucose, Bld: 116 mg/dL — ABNORMAL HIGH (ref 70–99)
Potassium: 4.7 mEq/L (ref 3.5–5.1)
Sodium: 128 mEq/L — ABNORMAL LOW (ref 135–145)
Total Bilirubin: 1 mg/dL (ref 0.2–1.2)
Total Protein: 7.8 g/dL (ref 6.0–8.3)

## 2019-06-20 LAB — LIPID PANEL
Cholesterol: 135 mg/dL (ref 0–200)
HDL: 43.7 mg/dL (ref >39.00)
LDL Cholesterol: 80 mg/dL (ref 0–99)
NonHDL: 91.32
Total CHOL/HDL Ratio: 3
Triglycerides: 56 mg/dL (ref 0.0–149.0)
VLDL: 11.2 mg/dL (ref 0.0–40.0)

## 2019-06-20 LAB — HEMOGLOBIN A1C: Hgb A1c MFr Bld: 6 % (ref 4.6–6.5)

## 2019-06-20 MED ORDER — HYDROCHLOROTHIAZIDE 25 MG PO TABS: 25.0000 mg | ORAL_TABLET | Freq: Every day | ORAL | 1 refills | Status: DC

## 2019-06-20 NOTE — Progress Notes (Signed)
Subjective:    Patient ID: Christopher Ferrell, male    DOB: 1955-10-18, 63 y.o.   MRN: [id number]  DOS:  06/20/2019 Type of visit - description: CPX In general feeling well. No major concerns Ambulatory BPs reviewed    BP Readings from Last 3 Encounters:  06/20/19 (!) 170/74  04/15/19 (!) 173/69  12/18/18 (!) 152/56    Review of Systems Specifically denies chest pain, difficulty breathing, cough or wheezing.   A 14 point review of systems is negative    Past Medical History:  Diagnosis Date  . COPD (chronic obstructive pulmonary disease) ([facility])   . Hypertension     Past Surgical History:  Procedure Laterality Date  . NO PAST SURGERIES      Social History   Socioeconomic History  . Marital status: Single    Spouse name: Not on file  . Number of children: 2  . Years of education: Not on file  . Highest education level: Not on file  Occupational History  . Occupation: Educational psychologist  Tobacco Use  . Smoking status: Current Every Day Smoker    Packs/day: 0.75    Types: Cigarettes  . Smokeless tobacco: Never Used  . Tobacco comment: < 1/2 ppd   Substance and Sexual Activity  . Alcohol use: Yes    Alcohol/week: 6.0 standard drinks    Types: 6 Cans of beer per week  . Drug use: No  . Sexual activity: Not on file  Other Topics Concern  . Not on file  Social History Narrative   Single, lives w/ fiance, pt has 2 children from previous relationship   Moved to [place] ~ 2000 from [place] Strain:   . Difficulty of Paying Living Expenses: Not on file  Food Insecurity:   . Worried About Charity fundraiser in the Last Year: Not on file  . Ran Out of Food in the Last Year: Not on file  Transportation Needs:   . Lack of Transportation (Medical): Not on file  . Lack of Transportation (Non-Medical): Not on file  Physical Activity:   . Days of Exercise per Week: Not on file  . Minutes of Exercise per Session:  Not on file  Stress:   . Feeling of Stress : Not on file  Social Connections:   . Frequency of Communication with Friends and Family: Not on file  . Frequency of Social Gatherings with Friends and Family: Not on file  . Attends Religious Services: Not on file  . Active Member of Clubs or Organizations: Not on file  . Attends Archivist Meetings: Not on file  . Marital Status: Not on file  Intimate Partner Violence:   . Fear of Current or Ex-Partner: Not on file  . Emotionally Abused: Not on file  . Physically Abused: Not on file  . Sexually Abused: Not on file     Family History  Problem Relation Age of Onset  . Cancer Father        type?  . CAD Neg Hx   . Stroke Neg Hx   . Diabetes Neg Hx   . Colon cancer Neg Hx   . Prostate cancer Neg Hx   . Rectal cancer Neg Hx   . Stomach cancer Neg Hx      Allergies as of 06/20/2019   No Known Allergies     Medication List  Accurate as of June 20, 2019  5:20 PM. If you have any questions, ask your nurse or doctor.        STOP taking these medications   cyclobenzaprine 10 MG tablet Commonly known as: FLEXERIL Stopped by: Willow Ora, MD     TAKE these medications   amLODipine 10 MG tablet Commonly known as: NORVASC Take 1 tablet (10 mg total) by mouth daily.   carvedilol 12.5 MG tablet Commonly known as: COREG Take 1 tablet (12.5 mg total) by mouth 2 (two) times daily with a meal.   hydrochlorothiazide 25 MG tablet Commonly known as: HYDRODIURIL Take 1 tablet (25 mg total) by mouth daily. Started by: Willow Ora, MD   losartan 100 MG tablet Commonly known as: COZAAR Take 1 tablet (100 mg total) by mouth daily.   sildenafil 20 MG tablet Commonly known as: REVATIO Take 4-5 tablets (80-100 mg total) by mouth at bedtime as needed.           Objective:   Physical Exam BP (!) 170/74 (BP Location: Left Arm, Patient Position: Sitting, Cuff Size: Small)   Pulse (!) 59   Temp (!) 96.6 F (35.9 C)  (Temporal)   Resp 16   Ht 6\' 4"  (1.93 m)   Wt 217 lb (98.4 kg)   SpO2 100%   BMI 26.41 kg/m  General: Well developed, NAD, BMI noted Neck: No  thyromegaly  HEENT:  Normocephalic . Face symmetric, atraumatic Lungs:  Slightly decreased breath sounds but clear Normal respiratory effort, no intercostal retractions, no accessory muscle use. Heart: RRR,  no murmur.  No pretibial edema bilaterally  Abdomen:  Not distended, soft, non-tender. No rebound or rigidity.   Skin: Exposed areas without rash. Not pale. Not jaundice Neurologic:  alert & oriented X3.  Speech normal, gait appropriate for age and unassisted Strength symmetric and appropriate for age.  Psych: Cognition and judgment appear intact.  Cooperative with normal attention span and concentration.  Behavior appropriate. No anxious or depressed appearing.     Assessment     Assessment Prediabetes: A1c 6.1  (12-03-15) HTN Pulm --COPD--PFTs 08/2013 show mild obstruction of some response to bronchodilators --Smoker  --Pulmonary nodule ECHO: 4-20 19: Done for question of pulmonary hypertension on CT chest.  Essentially negative, grade 2 diastolic dysfunction noted. BPH, enlarge prostate, asx  PLAN: Here for CPX Prediabetes: Check A1c HTN: Currently on amlodipine, carvedilol, losartan.  BPs at home in the 145, 150 range.  BP rechecked by me manually today: 170/80, BPs at the office are consistently higher than at home. Plan: Add HCTZ to be taken in the morning along with losartan, monitor BPs, BMP in 2 to 3 weeks. If good response, will prescribe losartan/HCT in 1 tablet. COPD: Essentially no symptoms Pulmonary nodule schedule CT RTC to 3 weeks for blood work, 4 months to see me.   This visit occurred during the SARS-CoV-2 public health emergency.  Safety protocols were in place, including screening questions prior to the visit, additional usage of staff PPE, and extensive cleaning of exam room while observing  appropriate contact time as indicated for disinfecting solutions.

## 2019-06-20 NOTE — Patient Instructions (Addendum)
GO TO THE LAB : Get the blood work     GO TO THE FRONT DESK Schedule your next appointment   for blood work for the first week of January  Schedule an appointment with me in 4 months   We are adding a medication called hydrochlorothiazide, take it in the morning along with losartan. All the other medications are the same  Check your blood pressure twice a week BP GOAL is between 110/65 and  135/85. If it is consistently higher or lower, let me know

## 2019-06-20 NOTE — Assessment & Plan Note (Signed)
Here for CPX Prediabetes: Check A1c HTN: Currently on amlodipine, carvedilol, losartan.  BPs at home in the 145, 150 range.  BP rechecked by me manually today: 170/80, BPs at the office are consistently higher than at home. Plan: Add HCTZ to be taken in the morning along with losartan, monitor BPs, BMP in 2 to 3 weeks. If good response, will prescribe losartan/HCT in 1 tablet. COPD: Essentially no symptoms Pulmonary nodule schedule CT RTC to 3 weeks for blood work, 4 months to see me.

## 2019-06-20 NOTE — Assessment & Plan Note (Signed)
-  Td 2013 - Pneumonia shot 2014 - prevnar 2016  - s/p shingrix  - had a f lu shot -CCS: prevoius GI ref and IFOBs failed, request cologuard, will do, rec to check w/ his insurance ref: cost -Prostate cancer screening: DRE wnl 2019, PSAs ok - Lung  cancer screening discussed: see comments under Pulm Nodule  -Labs: CMP, FLP, CBC, A1c -Diet and exercise discussed -Tobacco: Smoking less than half pack a day, encouraged to continue thinking about quitting, discussed medications that are available to him.

## 2019-07-10 IMAGING — CT CT CHEST W/O CM
2 of 3 series · 15 of 36 positions shown, 18 images · non-contrast
Comparison: Screening CT 03/17/2017

CLINICAL DATA: Follow-up of pulmonary nodules on screening CT.

EXAM:
CT CHEST WITHOUT CONTRAST
TECHNIQUE: Multidetector CT imaging of the chest was performed following the
standard protocol without IV contrast.

[Series 2: thorax · axial · 0.80mm/px · z∈[-347,-61]mm · 12 of 169 slices shown, 15 images]
[im 13/169  mediastinal]
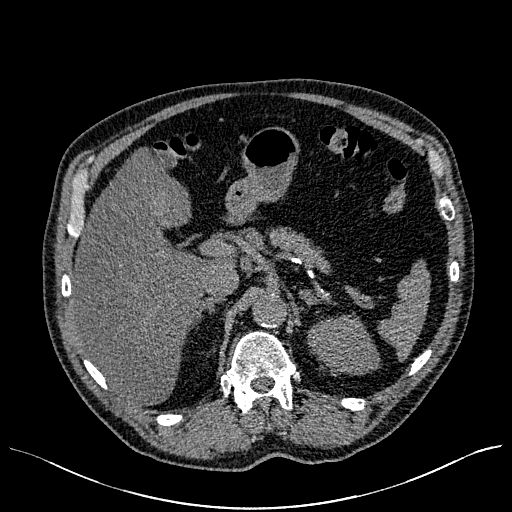
[im 13/169  lung]
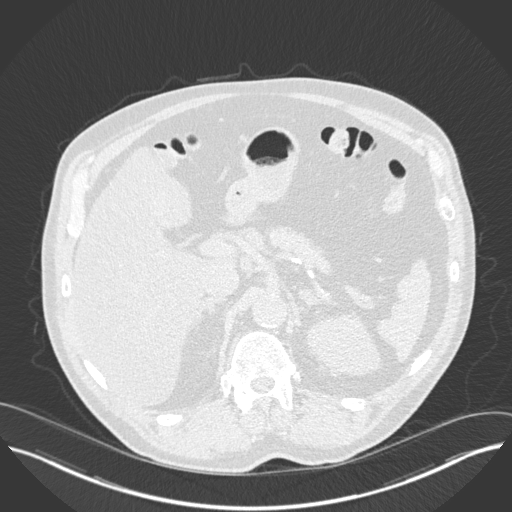
[im 25/169  lung]
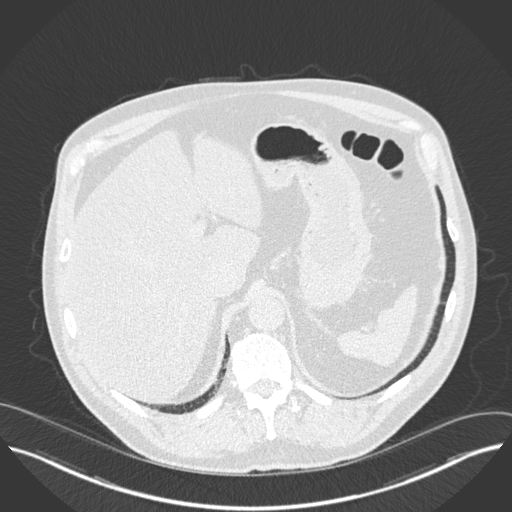
[im 38/169  lung]
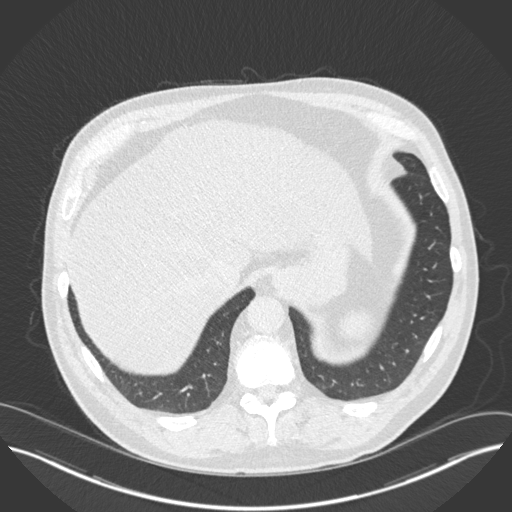
[im 50/169  lung]
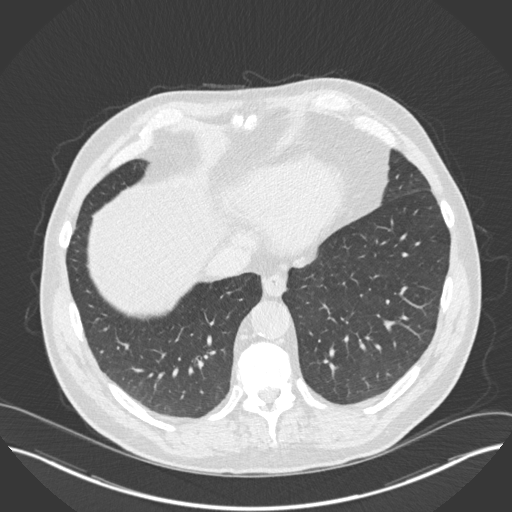
[im 63/169  mediastinal]
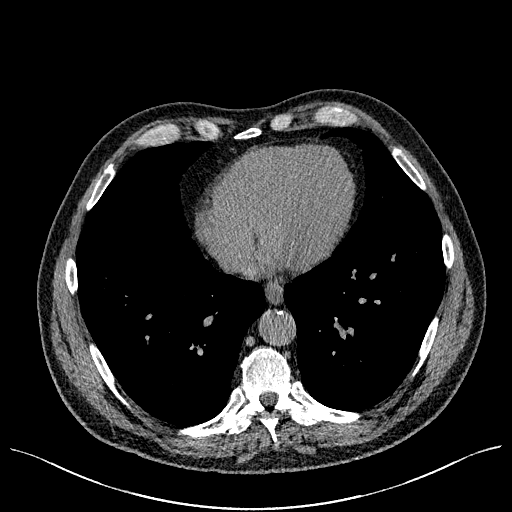
[im 63/169  lung]
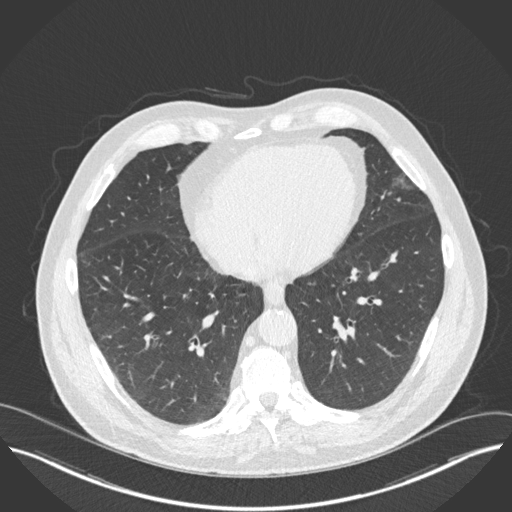
[im 75/169  lung]
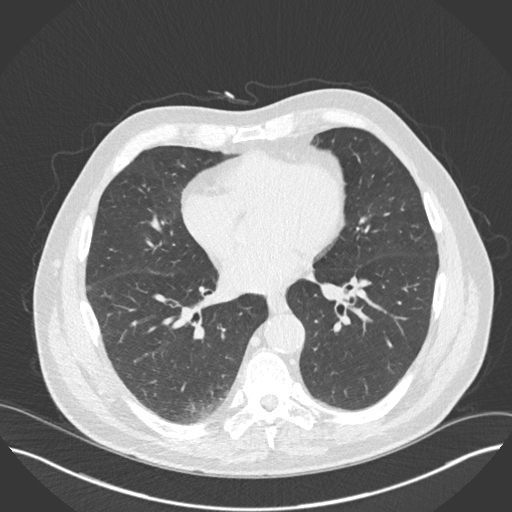
[im 94/169  lung]
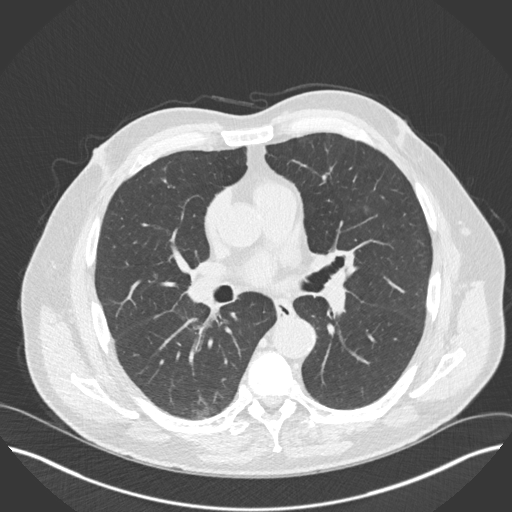
[im 106/169  lung]
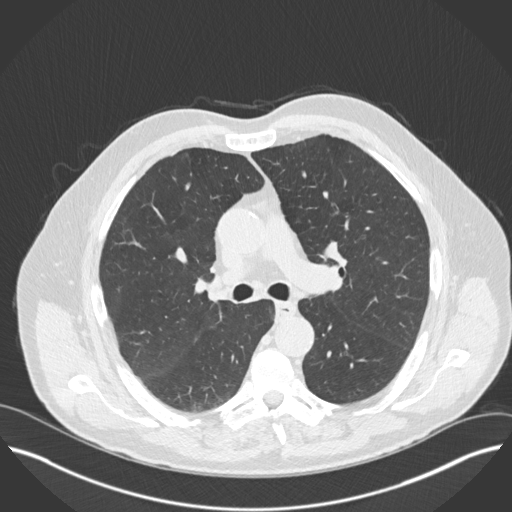
[im 119/169  mediastinal]
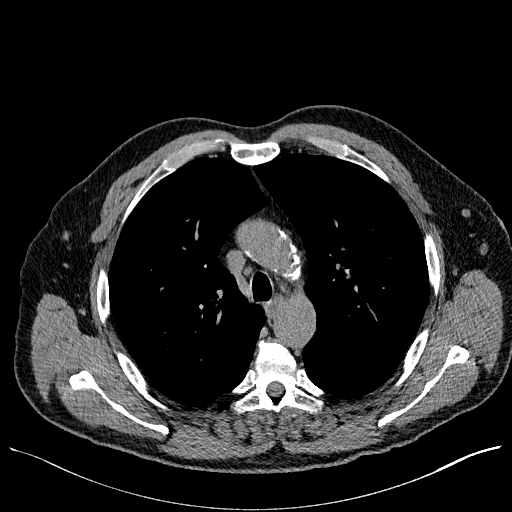
[im 119/169  lung]
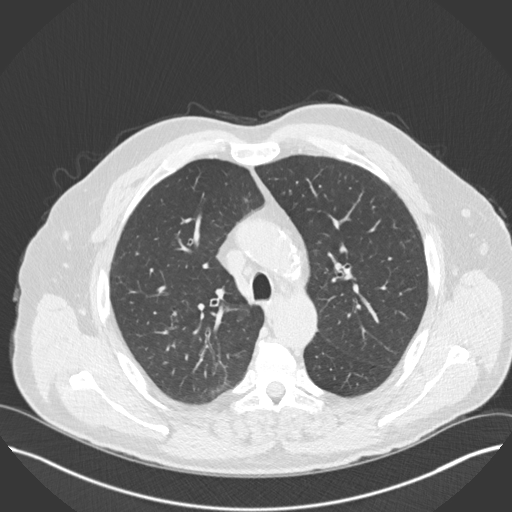
[im 131/169  lung]
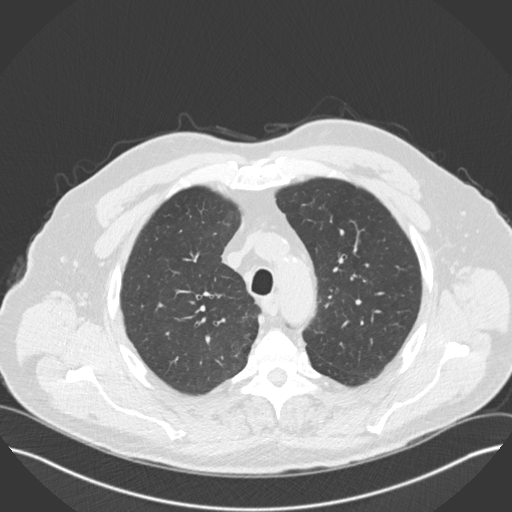
[im 144/169  lung]
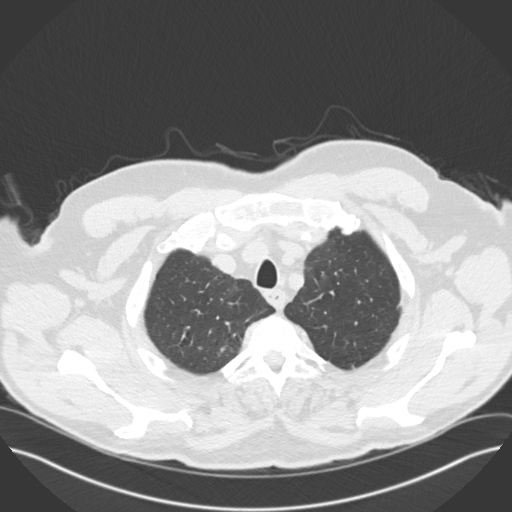
[im 156/169  lung]
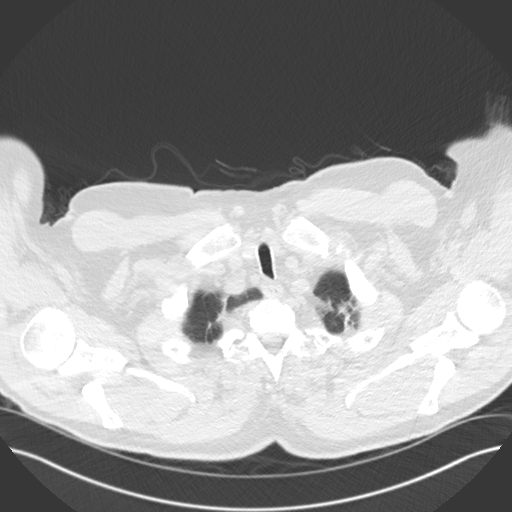

[Series 5: coronal · coronal · 0.69mm/px · 3 of 152 slices shown]
[im 31/152  lung]
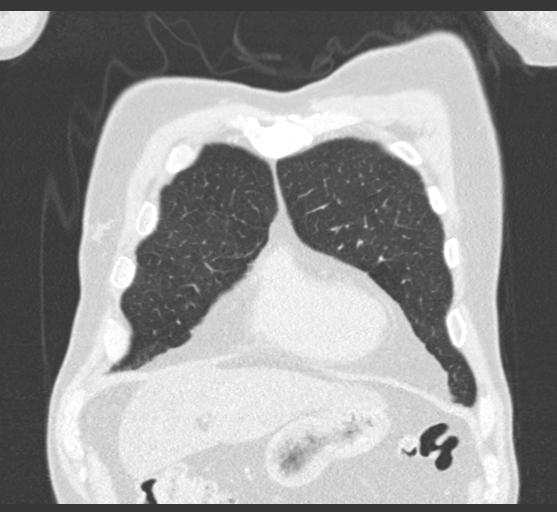
[im 61/152  lung]
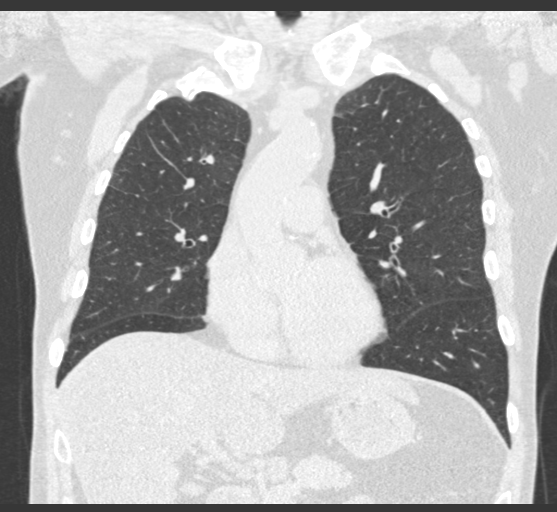
[im 91/152  lung]
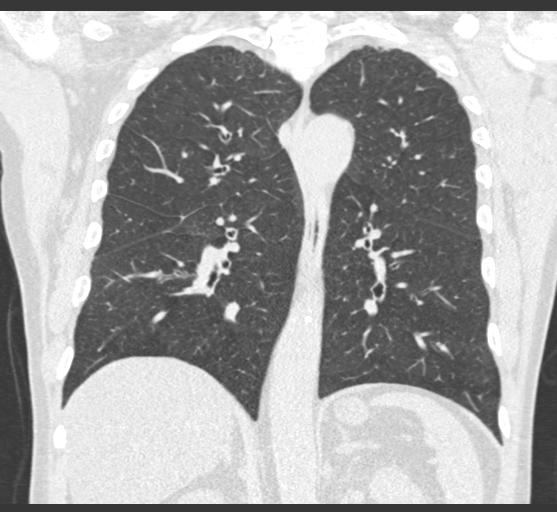

[15 of 36 positions shown; findings below may reference images not displayed]

FINDINGS: Cardiovascular: Aortic and branch vessel atherosclerosis. Normal
heart size, without pericardial effusion. Multivessel coronary
artery atherosclerosis. Pulmonary artery enlargement, outflow tract
3.4 cm.

Mediastinum/Nodes: Multiple small middle mediastinal nodes. None are
pathologic by size criteria. Hilar regions poorly evaluated without
intravenous contrast.

Lungs/Pleura: No pleural fluid.  Mild centrilobular emphysema.

Right lower lobe pulmonary nodule measures 7 x 7 mm on image 89/3
and is unchanged.

Similar 7 mm subpleural lymph node along the left major fissure on
image 78/3.

Smaller pulmonary nodules are primarily subpleural in distribution
and identified on series 3. Example at 4 mm in the right upper lobe
on image [DATE]. These are not significantly changed.

Upper Abdomen: Moderate to marked hepatic steatosis. Normal imaged
portions of the spleen, stomach, pancreas, gallbladder, right
adrenal gland, kidneys. Mild left adrenal nodularity is unchanged.

Musculoskeletal: No acute osseous abnormality.
IMPRESSION: 1. Similar appearance of bilateral pulmonary nodules, including a
dominant 7 mm right lower lobe pulmonary nodule. The patient was
inadvertently evaluated using standard diagnostic CT, rather than a
follow-up lung cancer screening CT. Therefore, recommend the patient
return to the screening population in 1 year.
2. Aortic atherosclerosis (STNGL-DBK.K), coronary artery
atherosclerosis and emphysema (STNGL-IAN.H).
3. Pulmonary artery enlargement suggests pulmonary arterial
hypertension.
4. Hepatic steatosis.

## 2019-07-11 ENCOUNTER — Other Ambulatory Visit: Payer: Self-pay

## 2019-07-11 ENCOUNTER — Other Ambulatory Visit (INDEPENDENT_AMBULATORY_CARE_PROVIDER_SITE_OTHER): Payer: BC Managed Care – PPO

## 2019-07-11 DIAGNOSIS — Z0000 Encounter for general adult medical examination without abnormal findings: Secondary | ICD-10-CM

## 2019-07-11 LAB — BASIC METABOLIC PANEL
BUN: 5 mg/dL — ABNORMAL LOW (ref 6–23)
CO2: 29 mEq/L (ref 19–32)
Calcium: 9.6 mg/dL (ref 8.4–10.5)
Chloride: 89 mEq/L — ABNORMAL LOW (ref 96–112)
Creatinine, Ser: 0.71 mg/dL (ref 0.40–1.50)
GFR: 111.88 mL/min (ref >60.00)
Glucose, Bld: 113 mg/dL — ABNORMAL HIGH (ref 70–99)
Potassium: 4.2 mEq/L (ref 3.5–5.1)
Sodium: 124 mEq/L — ABNORMAL LOW (ref 135–145)

## 2019-07-17 ENCOUNTER — Other Ambulatory Visit: Payer: Self-pay | Admitting: Internal Medicine

## 2019-07-17 DIAGNOSIS — Z122 Encounter for screening for malignant neoplasm of respiratory organs: Secondary | ICD-10-CM

## 2019-07-18 ENCOUNTER — Telehealth: Payer: Self-pay

## 2019-07-18 ENCOUNTER — Ambulatory Visit (HOSPITAL_BASED_OUTPATIENT_CLINIC_OR_DEPARTMENT_OTHER): Admission: RE | Admit: 2019-07-18 | Payer: BC Managed Care – PPO | Source: Ambulatory Visit

## 2019-07-18 ENCOUNTER — Ambulatory Visit (HOSPITAL_BASED_OUTPATIENT_CLINIC_OR_DEPARTMENT_OTHER)
Admission: RE | Admit: 2019-07-18 | Discharge: 2019-07-18 | Disposition: A | Discharge status: Home / Self Care | Payer: BC Managed Care – PPO | Source: Ambulatory Visit | Attending: Internal Medicine | Admitting: Internal Medicine

## 2019-07-18 ENCOUNTER — Other Ambulatory Visit: Payer: Self-pay

## 2019-07-18 DIAGNOSIS — Z122 Encounter for screening for malignant neoplasm of respiratory organs: Secondary | ICD-10-CM | POA: Insufficient documentation

## 2019-07-18 DIAGNOSIS — R9389 Abnormal findings on diagnostic imaging of other specified body structures: Secondary | ICD-10-CM

## 2019-07-18 DIAGNOSIS — R911 Solitary pulmonary nodule: Secondary | ICD-10-CM

## 2019-07-18 DIAGNOSIS — Z87891 Personal history of nicotine dependence: Secondary | ICD-10-CM | POA: Diagnosis not present

## 2019-07-18 NOTE — Telephone Encounter (Signed)
Misty Stanley from [facility] radiology called and stated patient has had a  lung caner screen and its reading suspicious left upper node nodule lung rads 4A suspicious.

## 2019-07-20 NOTE — Telephone Encounter (Signed)
-   CT lung abnormal with a new nodule.Will refer to pulmonary. - Hyponatremia: HCTZ stopped, now on clonidine. - Called patient: No answer, will try again Monday and let him know about above.

## 2019-07-23 NOTE — Telephone Encounter (Signed)
Left a detailed message for the patient. Also asked to let me know how his blood pressure is doing now.  Please enter pulmonary referral, DX pulmonary nodule, abnormal CT chest (Not urgent)

## 2019-07-23 NOTE — Addendum Note (Signed)
Addended byConrad [place] D on: 07/23/2019 10:59 AM   Modules accepted: Orders

## 2019-07-23 NOTE — Telephone Encounter (Signed)
Pulm referral placed.

## 2019-07-24 ENCOUNTER — Other Ambulatory Visit: Payer: Self-pay | Admitting: *Deleted

## 2019-07-24 DIAGNOSIS — E871 Hypo-osmolality and hyponatremia: Secondary | ICD-10-CM

## 2019-07-24 MED ORDER — CLONIDINE HCL 0.1 MG PO TABS: 0.1000 mg | ORAL_TABLET | Freq: Two times a day (BID) | ORAL | 6 refills | Status: DC

## 2019-08-15 ENCOUNTER — Other Ambulatory Visit (INDEPENDENT_AMBULATORY_CARE_PROVIDER_SITE_OTHER): Payer: BC Managed Care – PPO

## 2019-08-15 ENCOUNTER — Other Ambulatory Visit: Payer: Self-pay | Admitting: Internal Medicine

## 2019-08-15 ENCOUNTER — Other Ambulatory Visit: Payer: Self-pay

## 2019-08-15 DIAGNOSIS — E871 Hypo-osmolality and hyponatremia: Secondary | ICD-10-CM

## 2019-08-15 LAB — BASIC METABOLIC PANEL
BUN: 4 mg/dL — ABNORMAL LOW (ref 6–23)
CO2: 29 mEq/L (ref 19–32)
Calcium: 9.4 mg/dL (ref 8.4–10.5)
Chloride: 95 mEq/L — ABNORMAL LOW (ref 96–112)
Creatinine, Ser: 0.72 mg/dL (ref 0.40–1.50)
GFR: 110.06 mL/min (ref >60.00)
Glucose, Bld: 98 mg/dL (ref 70–99)
Potassium: 4.3 mEq/L (ref 3.5–5.1)
Sodium: 130 mEq/L — ABNORMAL LOW (ref 135–145)

## 2019-08-16 MED ORDER — CLONIDINE HCL 0.3 MG PO TABS: 0.3000 mg | ORAL_TABLET | Freq: Two times a day (BID) | ORAL | 3 refills | Status: DC

## 2019-08-16 NOTE — Addendum Note (Signed)
Addended byConrad [place] D on: 08/16/2019 05:02 PM   Modules accepted: Orders

## 2019-08-29 ENCOUNTER — Institutional Professional Consult (permissible substitution): Payer: BC Managed Care – PPO | Admitting: Pulmonary Disease

## 2019-09-05 ENCOUNTER — Encounter: Payer: Self-pay | Admitting: Pulmonary Disease

## 2019-09-05 ENCOUNTER — Ambulatory Visit: Payer: BC Managed Care – PPO | Admitting: Pulmonary Disease

## 2019-09-05 ENCOUNTER — Other Ambulatory Visit: Payer: Self-pay

## 2019-09-05 VITALS — BP 190/74 | HR 64 | Temp 98.0°F | Ht 76.0 in | Wt 211.2 lb

## 2019-09-05 DIAGNOSIS — R911 Solitary pulmonary nodule: Secondary | ICD-10-CM | POA: Diagnosis not present

## 2019-09-05 DIAGNOSIS — R918 Other nonspecific abnormal finding of lung field: Secondary | ICD-10-CM

## 2019-09-05 DIAGNOSIS — Z87891 Personal history of nicotine dependence: Secondary | ICD-10-CM

## 2019-09-05 DIAGNOSIS — J432 Centrilobular emphysema: Secondary | ICD-10-CM | POA: Diagnosis not present

## 2019-09-05 NOTE — Patient Instructions (Signed)
Thank you for visiting Dr. Tonia Brooms at [facility]. Today we recommend the following:  Orders Placed This Encounter  Procedures  . CT Chest Wo Contrast  . Pulmonary Function Test   Return in about 3 months (around 12/06/2019) for w/ Dr. Tonia Brooms following CT imaging .    Please do your part to reduce the spread of COVID-19.

## 2019-09-05 NOTE — Progress Notes (Signed)
Synopsis: Referred in March 2021 for abnormal lung cancer screening CT by Wanda Plump, MD  Subjective:   PATIENT ID: Christopher Ferrell GENDER: male DOB: 04-10-1956, MRN: [id number]  Chief Complaint  Patient presents with  . Consult    Patient is here for nodules that were found on recent CT of chest. Patient has a cough with white sputum    This is a 64 year old gentleman with a past medical history of COPD, hypertension.  Longstanding history of smoking.  Patient had initial lung cancer screening CT in 2018.  He has a 43-pack-year history of smoking.  This revealed a lung RADS 3 with a short-term recommended follow-up at 6 months.  He had follow-up CT in April 2019 which documented stability of a 7 mm right lower lobe pulmonary nodule.  Patient ultimately underwent a repeat lung cancer screening CT in January 2021 which read as lung RADS 4A with a new 6.9 mm posterior left upper lobe lung nodule and stability of his previous lung nodules.  Also has evidence of centrilobular emphysema and aortic calcifications.  Today in the office we reviewed the CT images.  Otherwise he has no respiratory complaints today.  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  Patient has not had full PFTs in the past.  He is planning to retire potentially next year.  Has no children and couple of dogs.    Past Medical History:  Diagnosis Date  . COPD (chronic obstructive pulmonary disease) (HCC)   . Hypertension      Family History  Problem Relation Age of Onset  . Cancer Father        type?  . CAD Neg Hx   . Stroke Neg Hx   . Diabetes Neg Hx   . Colon cancer Neg Hx   . Prostate cancer Neg Hx   . Rectal cancer Neg Hx   . Stomach cancer Neg Hx      Past Surgical History:  Procedure Laterality Date  . NO PAST SURGERIES      Social History   Socioeconomic History  . Marital status: Single    Spouse name: Not on file  . Number of children: 2  . Years of  education: Not on file  . Highest education level: Not on file  Occupational History  . Occupation: Furniture conservator/restorer  Tobacco Use  . Smoking status: Current Every Day Smoker    Packs/day: 0.75    Types: Cigarettes  . Smokeless tobacco: Never Used  . Tobacco comment: < 1/2 ppd   Substance and Sexual Activity  . Alcohol use: Yes    Alcohol/week: 6.0 standard drinks    Types: 6 Cans of beer per week  . Drug use: No  . Sexual activity: Not on file  Other Topics Concern  . Not on file  Social History Narrative   Single, lives w/ fiance, pt has 2 children from previous relationship   Moved to [place] ~ 2000 from [place]       Social Determinants of Health   Financial Resource Strain:   . Difficulty of Paying Living Expenses: Not on file  Food Insecurity:   . Worried About Programme researcher, broadcasting/film/video in the Last Year: Not on file  . Ran Out of Food in the Last Year: Not on file  Transportation Needs:   . Lack of Transportation (Medical): Not on file  . Lack of Transportation (Non-Medical): Not on file  Physical Activity:   .  Days of Exercise per Week: Not on file  . Minutes of Exercise per Session: Not on file  Stress:   . Feeling of Stress : Not on file  Social Connections:   . Frequency of Communication with Friends and Family: Not on file  . Frequency of Social Gatherings with Friends and Family: Not on file  . Attends Religious Services: Not on file  . Active Member of Clubs or Organizations: Not on file  . Attends Banker Meetings: Not on file  . Marital Status: Not on file  Intimate Partner Violence:   . Fear of Current or Ex-Partner: Not on file  . Emotionally Abused: Not on file  . Physically Abused: Not on file  . Sexually Abused: Not on file     No Known Allergies   Outpatient Medications Prior to Visit  Medication Sig Dispense Refill  . amLODipine (NORVASC) 10 MG tablet Take 1 tablet (10 mg total) by mouth daily. 90 tablet 1  . carvedilol (COREG) 12.5 MG  tablet Take 1 tablet (12.5 mg total) by mouth 2 (two) times daily with a meal. 180 tablet 1  . cloNIDine (CATAPRES) 0.3 MG tablet Take 1 tablet (0.3 mg total) by mouth 2 (two) times daily. 60 tablet 3  . losartan (COZAAR) 100 MG tablet Take 1 tablet (100 mg total) by mouth daily. 90 tablet 2  . sildenafil (REVATIO) 20 MG tablet Take 4-5 tablets (80-100 mg total) by mouth at bedtime as needed. 30 tablet 3   No facility-administered medications prior to visit.    Review of Systems  Constitutional: Negative for chills, fever, malaise/fatigue and weight loss.  HENT: Negative for hearing loss, sore throat and tinnitus.   Eyes: Negative for blurred vision and double vision.  Respiratory: Negative for cough, hemoptysis, sputum production, shortness of breath, wheezing and stridor.   Cardiovascular: Negative for chest pain, palpitations, orthopnea, leg swelling and PND.  Gastrointestinal: Negative for abdominal pain, constipation, diarrhea, heartburn, nausea and vomiting.  Genitourinary: Negative for dysuria, hematuria and urgency.  Musculoskeletal: Negative for joint pain and myalgias.  Skin: Negative for itching and rash.  Neurological: Negative for dizziness, tingling, weakness and headaches.  Endo/Heme/Allergies: Negative for environmental allergies. Does not bruise/bleed easily.  Psychiatric/Behavioral: Negative for depression. The patient is not nervous/anxious and does not have insomnia.   All other systems reviewed and are negative.    Objective:  Physical Exam Vitals reviewed.  Constitutional:      General: He is not in acute distress.    Appearance: He is well-developed.  HENT:     Head: Normocephalic and atraumatic.  Eyes:     General: No scleral icterus.    Conjunctiva/sclera: Conjunctivae normal.     Pupils: Pupils are equal, round, and reactive to light.  Neck:     Vascular: No JVD.     Trachea: No tracheal deviation.  Cardiovascular:     Rate and Rhythm: Normal rate  and regular rhythm.     Heart sounds: Normal heart sounds. No murmur.  Pulmonary:     Effort: Pulmonary effort is normal. No tachypnea, accessory muscle usage or respiratory distress.     Breath sounds: Normal breath sounds. No stridor. No wheezing, rhonchi or rales.  Musculoskeletal:        General: No tenderness.     Cervical back: Neck supple.  Lymphadenopathy:     Cervical: No cervical adenopathy.  Skin:    General: Skin is warm and dry.     Capillary  Refill: Capillary refill takes less than 2 seconds.     Findings: No rash.  Neurological:     Mental Status: He is alert and oriented to person, place, and time.  Psychiatric:        Behavior: Behavior normal.      Vitals:   09/05/19 1536  BP: (!) 190/74  Pulse: 64  Temp: 98 F (36.7 C)  TempSrc: Temporal  SpO2: 96%  Weight: 211 lb 3.2 oz (95.8 kg)  Height: 6\' 4"  (1.93 m)   96% on RA BMI Readings from Last 3 Encounters:  09/05/19 25.71 kg/m  06/20/19 26.41 kg/m  04/15/19 26.41 kg/m   Wt Readings from Last 3 Encounters:  09/05/19 211 lb 3.2 oz (95.8 kg)  06/20/19 217 lb (98.4 kg)  04/15/19 217 lb (98.4 kg)     CBC    Component Value Date/Time   WBC 8.6 06/20/2019 1154   RBC 5.20 06/20/2019 1154   HGB 17.5 (H) 06/20/2019 1154   HCT 51.2 06/20/2019 1154   PLT 210.0 Repeated and verified X2. 06/20/2019 1154   MCV 98.5 06/20/2019 1154   MCHC 34.1 06/20/2019 1154   RDW 12.9 06/20/2019 1154   LYMPHSABS 1.5 06/20/2018 0810   MONOABS 0.9 06/20/2018 0810   EOSABS 0.2 06/20/2018 0810   BASOSABS 0.1 06/20/2018 0810     Chest Imaging: 07/18/2019: Lung cancer screening CT lung RADS 4A read by radiology.  Images reviewed by myself with a new 6.9 mm posterior left upper lobe lung nodule lower lobe lung nodule in the right stable in comparison to previous.  Reviewed images from 2018 and 2019 as comparison with patient today in the office.The patient's images have been independently reviewed by me.    Pulmonary  Functions Testing Results: No flowsheet data found.   Assessment & Plan:     ICD-10-CM   1. Lung nodule  R91.1 CT Chest Wo Contrast    Pulmonary Function Test  2. Centrilobular emphysema (HCC)  J43.2   3. Abnormal CT lung screening  R91.8   4. Former smoker  Z87.891     Discussion:  This is a 64 year old gentleman new left upper lobe posterior lung nodule.  Centrilobular emphysema on CT imaging.  Longstanding history of smoking.  Assessment:   New lung nodule could represent the start of a lung cancer.  This highlights importance of lung cancer screening.  He also has evidence of centrilobular emphysema.  Currently on no inhalers.  Prognosis of this lung nodule is yet unknown.  And will require follow-up.  Plan Following Extensive Data Review & Interpretation:  . I reviewed prior external note(s) from primary care office visit 06/20/2019 Dr. 06/22/2019 . I reviewed the result(s) of visit metabolic panel 08/15/2019, sodium 130, serum creatinine 0.72 . I have ordered repeat noncontrast CT of the chest in 3 months for follow-up short-term of the new left nodule.  Also full pulmonary function test can be completed at the day of next office visit.  Independent interpretation of tests . Review of patient's CT chest images revealed new left upper lung nodule. The patient's images have been independently reviewed by me.  Documented above.  Return to clinic in 3 months with repeat CT and PFTs.  We appreciate the consultation.   Current Outpatient Medications:  .  amLODipine (NORVASC) 10 MG tablet, Take 1 tablet (10 mg total) by mouth daily., Disp: 90 tablet, Rfl: 1 .  carvedilol (COREG) 12.5 MG tablet, Take 1 tablet (12.5 mg total) by mouth  2 (two) times daily with a meal., Disp: 180 tablet, Rfl: 1 .  cloNIDine (CATAPRES) 0.3 MG tablet, Take 1 tablet (0.3 mg total) by mouth 2 (two) times daily., Disp: 60 tablet, Rfl: 3 .  losartan (COZAAR) 100 MG tablet, Take 1 tablet (100 mg total) by mouth  daily., Disp: 90 tablet, Rfl: 2 .  sildenafil (REVATIO) 20 MG tablet, Take 4-5 tablets (80-100 mg total) by mouth at bedtime as needed., Disp: 30 tablet, Rfl: 3   Garner Nash, DO [facility] 09/05/2019 3:55 PM

## 2019-09-06 ENCOUNTER — Other Ambulatory Visit: Payer: Self-pay | Admitting: Internal Medicine

## 2019-10-16 ENCOUNTER — Other Ambulatory Visit: Payer: Self-pay

## 2019-10-20 ENCOUNTER — Other Ambulatory Visit: Payer: Self-pay | Admitting: Internal Medicine

## 2019-10-21 ENCOUNTER — Ambulatory Visit: Payer: BC Managed Care – PPO | Admitting: Internal Medicine

## 2019-10-23 ENCOUNTER — Ambulatory Visit: Payer: BC Managed Care – PPO | Admitting: Internal Medicine

## 2019-10-30 ENCOUNTER — Encounter: Payer: Self-pay | Admitting: Internal Medicine

## 2019-10-30 ENCOUNTER — Other Ambulatory Visit: Payer: Self-pay

## 2019-10-30 ENCOUNTER — Ambulatory Visit: Payer: BC Managed Care – PPO | Admitting: Internal Medicine

## 2019-10-30 VITALS — BP 152/58 | HR 56 | Temp 96.9°F | Resp 18 | Ht 76.0 in | Wt 204.5 lb

## 2019-10-30 DIAGNOSIS — I10 Essential (primary) hypertension: Secondary | ICD-10-CM

## 2019-10-30 DIAGNOSIS — R918 Other nonspecific abnormal finding of lung field: Secondary | ICD-10-CM

## 2019-10-30 NOTE — Patient Instructions (Addendum)
COVID-19 Vaccine Information can be found at: [email or web address] For questions related to vaccine distribution or appointments, please email [email or web address] or call [phone].   Increase carvedilol 12.5 mg tablets: Take 1.5 tablet twice a day  Watch your blood pressure and heart rate.  The heart rate should not be lower done 48-50   BP GOAL is between 110/65 and  145/85. If it is consistently higher or lower, let me know    GO TO THE FRONT DESK, PLEASE SCHEDULE YOUR APPOINTMENTS Come back for   a checkup in 4 months

## 2019-10-30 NOTE — Progress Notes (Signed)
Subjective:    Patient ID: Christopher Ferrell, male    DOB: May 17, 1956, 63 y.o.   MRN: [id number]  DOS:  10/30/2019 Type of visit - description: Routine visit Today we discussed his high blood pressure. Also has lung nodule, notes from pulmonary reviewed Has lost some weight, he thinks is due to increased activity since the weather is better than the winter  Wt Readings from Last 3 Encounters:  10/30/19 204 lb 8 oz (92.8 kg)  09/05/19 211 lb 3.2 oz (95.8 kg)  06/20/19 217 lb (98.4 kg)    BP Readings from Last 3 Encounters:  10/30/19 (!) 152/58  09/05/19 (!) 190/74  06/20/19 (!) 170/74    Review of Systems Denies chest pain, lower extremity edema.  No DOE with routine activities. No cough or sputum production.  Past Medical History:  Diagnosis Date  . COPD (chronic obstructive pulmonary disease) (HCC)   . Hypertension     Past Surgical History:  Procedure Laterality Date  . NO PAST SURGERIES      Allergies as of 10/30/2019   No Known Allergies     Medication List       Accurate as of October 30, 2019  9:23 PM. If you have any questions, ask your nurse or doctor.        amLODipine 10 MG tablet Commonly known as: NORVASC Take 1 tablet (10 mg total) by mouth daily.   carvedilol 12.5 MG tablet Commonly known as: COREG Take 1.5 tablets (18.75 mg total) by mouth 2 (two) times daily with a meal. What changed: how much to take Changed by: Willow Ora, MD   cloNIDine 0.3 MG tablet Commonly known as: Catapres Take 1 tablet (0.3 mg total) by mouth 2 (two) times daily.   losartan 100 MG tablet Commonly known as: COZAAR Take 1 tablet (100 mg total) by mouth daily.   sildenafil 20 MG tablet Commonly known as: REVATIO Take 4-5 tablets (80-100 mg total) by mouth at bedtime as needed.          Objective:   Physical Exam BP (!) 152/58 (BP Location: Left Arm, Patient Position: Sitting, Cuff Size: Normal)   Pulse (!) 56   Temp (!) 96.9 F (36.1 C) (Temporal)   Resp  18   Ht 6\' 4"  (1.93 m)   Wt 204 lb 8 oz (92.8 kg)   SpO2 100%   BMI 24.89 kg/m  General:   Well developed, NAD, BMI noted. HEENT:  Normocephalic . Face symmetric, atraumatic Lungs:  Decreased breath sounds Normal respiratory effort, no intercostal retractions, no accessory muscle use. Heart: RRR,  no murmur.  Lower extremities: no pretibial edema bilaterally  Skin: Not pale. Not jaundice Neurologic:  alert & oriented X3.  Speech normal, gait appropriate for age and unassisted Psych--  Cognition and judgment appear intact.  Cooperative with normal attention span and concentration.  Behavior appropriate. No anxious or depressed appearing.      Assessment     Assessment Prediabetes: A1c 6.1  (12-03-15) HTN Pulm --COPD--PFTs 08/2013 show mild obstruction of some response to bronchodilators --Smoker  --Pulmonary nodule ECHO: 4-20 19: Done for question of pulmonary hypertension on CT chest.  Essentially negative, grade 2 diastolic dysfunction noted. BPH, enlarge prostate, asx  PLAN: HTN: HCTZ added at the last visit but his chronic- mild hyponatremia worsened thus diuretics were stopped, follow-up BMP satisfactory on 08/15/2019. Currently on clonidine, losartan, carvedilol 12.5 mg twice a day, amlodipine. BP at the pulmonary office 09/05/2019 190/74, today  slightly elevated.  At home BPs are consistently in the 150s and rarely in the 130s. Plan: Increase carvedilol to 1.5 tablets twice a day, watch heart rate, will set a goal of ambulatory BPs is less than 145.  See AVS Pulmonary nodules: At the last CT had a new pulmonary nodule, saw pulmonary 09/05/2019, plan is to recheck CT soon. Preventive care: We agreed he will pursue Covid vaccination RTC 4 months     This visit occurred during the SARS-CoV-2 public health emergency.  Safety protocols were in place, including screening questions prior to the visit, additional usage of staff PPE, and extensive cleaning of exam room while  observing appropriate contact time as indicated for disinfecting solutions.

## 2019-10-30 NOTE — Assessment & Plan Note (Signed)
HTN: HCTZ added at the last visit but his chronic- mild hyponatremia worsened thus diuretics were stopped, follow-up BMP satisfactory on 08/15/2019. Currently on clonidine, losartan, carvedilol 12.5 mg twice a day, amlodipine. BP at the pulmonary office 09/05/2019 190/74, today slightly elevated.  At home BPs are consistently in the 150s and rarely in the 130s. Plan: Increase carvedilol to 1.5 tablets twice a day, watch heart rate, will set a goal of ambulatory BPs is less than 145.  See AVS Pulmonary nodules: At the last CT had a new pulmonary nodule, saw pulmonary 09/05/2019, plan is to recheck CT soon. Preventive care: We agreed he will pursue Covid vaccination RTC 4 months

## 2019-10-30 NOTE — Progress Notes (Signed)
Pre visit review using our clinic review tool, if applicable. No additional management support is needed unless otherwise documented below in the visit note. 

## 2019-10-31 ENCOUNTER — Ambulatory Visit: Payer: BC Managed Care – PPO | Attending: Internal Medicine

## 2019-10-31 DIAGNOSIS — Z23 Encounter for immunization: Secondary | ICD-10-CM

## 2019-10-31 NOTE — Progress Notes (Signed)
   Covid-19 Vaccination Clinic  Name:  Christopher Ferrell    MRN: [id number] DOB: 04-Apr-1956  10/31/2019  [name] was observed post Covid-19 immunization for 15 minutes without incident. He was provided with Vaccine Information Sheet and instruction to access the V-Safe system.   [name] was instructed to call 911 with any severe reactions post vaccine: Marland Kitchen Difficulty breathing  . Swelling of face and throat  . A fast heartbeat  . A bad rash all over body  . Dizziness and weakness   Immunizations Administered    Name Date Dose VIS Date Route   Pfizer COVID-19 Vaccine 10/31/2019 12:00 PM 0.3 mL 08/28/2018 Intramuscular   Manufacturer: [employer], [employer]   Lot: [id number]   NDC: [address]-1

## 2019-11-25 ENCOUNTER — Ambulatory Visit: Payer: BC Managed Care – PPO | Attending: Internal Medicine

## 2019-11-25 DIAGNOSIS — Z23 Encounter for immunization: Secondary | ICD-10-CM

## 2019-11-25 NOTE — Progress Notes (Signed)
   Covid-19 Vaccination Clinic  Name:  Christopher Ferrell    MRN: [id number] DOB: May 07, 1956  11/25/2019  [name] was observed post Covid-19 immunization for 15 minutes without incident. He was provided with Vaccine Information Sheet and instruction to access the V-Safe system.   [name] was instructed to call 911 with any severe reactions post vaccine: Marland Kitchen Difficulty breathing  . Swelling of face and throat  . A fast heartbeat  . A bad rash all over body  . Dizziness and weakness   Immunizations Administered    Name Date Dose VIS Date Route   Pfizer COVID-19 Vaccine 11/25/2019 12:16 PM 0.3 mL 08/28/2018 Intramuscular   Manufacturer: [employer], [employer]   Lot: [id number]   NDC: [address]-5

## 2019-11-26 ENCOUNTER — Ambulatory Visit (HOSPITAL_BASED_OUTPATIENT_CLINIC_OR_DEPARTMENT_OTHER)
Admission: RE | Admit: 2019-11-26 | Discharge: 2019-11-26 | Disposition: A | Discharge status: Home / Self Care | Payer: BC Managed Care – PPO | Source: Ambulatory Visit | Attending: Pulmonary Disease | Admitting: Pulmonary Disease

## 2019-11-26 ENCOUNTER — Other Ambulatory Visit: Payer: Self-pay

## 2019-11-26 DIAGNOSIS — J439 Emphysema, unspecified: Secondary | ICD-10-CM | POA: Diagnosis not present

## 2019-11-26 DIAGNOSIS — R911 Solitary pulmonary nodule: Secondary | ICD-10-CM | POA: Insufficient documentation

## 2019-11-26 DIAGNOSIS — R918 Other nonspecific abnormal finding of lung field: Secondary | ICD-10-CM | POA: Diagnosis not present

## 2019-11-27 ENCOUNTER — Telehealth: Payer: Self-pay | Admitting: Internal Medicine

## 2019-11-27 NOTE — Telephone Encounter (Signed)
Call him and let him know it looks better and is not something to worry about at this time,  we do not need to pursue additional diagnostic studies.

## 2019-11-27 NOTE — Telephone Encounter (Signed)
Received call report from [facility] with [facility] on patient's CT done on 11/26/19. DS please review the result/impression copied below:  Patient of Dr. Tonia Brooms  IMPRESSION: 1. Decreased left upper lobe pulmonary nodule since prior study, now smaller in size with decreased solid component, likely postinflammatory or postinfectious. Resumption of low-dose lung cancer screening CT in 1 year is recommended. 2. Stable subcentimeter pulmonary nodules, likely sequela from previous granulomatous disease. 3. Aortic Atherosclerosis (ICD10-I70.0) and Emphysema (ICD10-J43.9).  Please advise, thank you.

## 2019-11-27 NOTE — Telephone Encounter (Signed)
ATC patient unable tp reach left detailed message per DPR and if he had any further questions to call our office.   Nothing further needed at this time

## 2019-12-02 ENCOUNTER — Ambulatory Visit (HOSPITAL_BASED_OUTPATIENT_CLINIC_OR_DEPARTMENT_OTHER): Payer: BC Managed Care – PPO

## 2019-12-04 ENCOUNTER — Other Ambulatory Visit: Payer: Self-pay | Admitting: Internal Medicine

## 2020-02-28 ENCOUNTER — Ambulatory Visit: Payer: BC Managed Care – PPO | Admitting: Internal Medicine

## 2020-03-07 ENCOUNTER — Other Ambulatory Visit: Payer: Self-pay | Admitting: Internal Medicine

## 2020-03-10 ENCOUNTER — Other Ambulatory Visit: Payer: Self-pay

## 2020-03-10 ENCOUNTER — Ambulatory Visit: Payer: BC Managed Care – PPO | Admitting: Internal Medicine

## 2020-03-10 ENCOUNTER — Encounter: Payer: Self-pay | Admitting: Internal Medicine

## 2020-03-10 VITALS — BP 159/80 | HR 54 | Temp 98.1°F | Resp 18 | Ht 76.0 in | Wt 204.4 lb

## 2020-03-10 DIAGNOSIS — I10 Essential (primary) hypertension: Secondary | ICD-10-CM | POA: Diagnosis not present

## 2020-03-10 DIAGNOSIS — R634 Abnormal weight loss: Secondary | ICD-10-CM | POA: Diagnosis not present

## 2020-03-10 DIAGNOSIS — T148XXA Other injury of unspecified body region, initial encounter: Secondary | ICD-10-CM | POA: Diagnosis not present

## 2020-03-10 NOTE — Progress Notes (Signed)
Pre visit review using our clinic review tool, if applicable. No additional management support is needed unless otherwise documented below in the visit note. 

## 2020-03-10 NOTE — Progress Notes (Signed)
Subjective:    Patient ID: Christopher Ferrell, male    DOB: October 16, 1955, 64 y.o.   MRN: [id number]  DOS:  03/10/2020 Type of visit - description: Follow-up Since the last office visit ambulatory BPs are very good.  He also reports that for the last 4 to 5 months appetite is not the same, has somewhat decreased energy and easy bruising.  Denies fever, chills, night sweats. Mild weight loss noted. No headache or visual disturbances No chest pain, difficulty breathing or palpitations. No anxiety or depression No nausea, vomiting, diarrhea or blood in the stools No blood in the urine   Wt Readings from Last 3 Encounters:  03/10/20 204 lb 6 oz (92.7 kg)  10/30/19 204 lb 8 oz (92.8 kg)  09/05/19 211 lb 3.2 oz (95.8 kg)     Review of Systems See above   Past Medical History:  Diagnosis Date  . COPD (chronic obstructive pulmonary disease) (HCC)   . Hypertension     Past Surgical History:  Procedure Laterality Date  . NO PAST SURGERIES      Allergies as of 03/10/2020   No Known Allergies     Medication List       Accurate as of March 10, 2020 11:59 PM. If you have any questions, ask your nurse or doctor.        amLODipine 10 MG tablet Commonly known as: NORVASC Take 1 tablet (10 mg total) by mouth daily.   carvedilol 12.5 MG tablet Commonly known as: COREG Take 1 tablet (12.5 mg total) by mouth 2 (two) times daily with a meal.   cloNIDine 0.3 MG tablet Commonly known as: Catapres Take 1 tablet (0.3 mg total) by mouth 2 (two) times daily.   losartan 100 MG tablet Commonly known as: COZAAR Take 1 tablet (100 mg total) by mouth daily.   sildenafil 20 MG tablet Commonly known as: REVATIO Take 4-5 tablets (80-100 mg total) by mouth at bedtime as needed.          Objective:   Physical Exam BP (!) 159/80 (BP Location: Left Arm, Patient Position: Sitting, Cuff Size: Normal)   Pulse (!) 54   Temp 98.1 F (36.7 C) (Oral)   Resp 18   Ht 6\' 4"  (1.93 m)   Wt  204 lb 6 oz (92.7 kg)   SpO2 97%   BMI 24.88 kg/m  General:   Well developed, NAD, BMI noted.  HEENT:  Normocephalic . Face symmetric, atraumatic Lymphatic system: No lymphadenopathies at the neck, supraclavicular area, axillary and groin areas. Lungs:  CTA B Normal respiratory effort, no intercostal retractions, no accessory muscle use. Heart: RRR,  no murmur.  Abdomen:  Not distended, soft, non-tender. No rebound or rigidity.  No organomegaly  skin: Not pale. Not jaundice Lower extremities: no pretibial edema bilaterally  Neurologic:  alert & oriented X3.  Speech normal, gait appropriate for age and unassisted Psych--  Cognition and judgment appear intact.  Cooperative with normal attention span and concentration.  Behavior appropriate. No anxious or depressed appearing.     Assessment    Assessment Prediabetes: A1c 6.1  (12-03-15) HTN Pulm --COPD--PFTs 08/2013 show mild obstruction of some response to bronchodilators --Smoker  --Pulmonary nodule ECHO: 4-20 19: Done for question of pulmonary hypertension on CT chest.  Essentially negative, grade 2 diastolic dysfunction noted. BPH, enlarge prostate, asx  PLAN: Prediabetes: Check A1c HTN: Ambulatory BPs never more than 142/80.  No change for now, continue w/ amlodipine, carvedilol (12.5 mg  twice daily), clonidine and losartan.  Check CMP. Decreased appetite, decreased energy, some weight loss, bruising: ROS w/ no red flags, physical exam is normal.  For completeness we will check TSH and a CBC with blood smear. Pulmonary nodules: CT 11/2019 showed decreased size of nodules, next CT 1 year. Recommend a flu shot this season RTC CPX about 4 months.   This visit occurred during the SARS-CoV-2 public health emergency.  Safety protocols were in place, including screening questions prior to the visit, additional usage of staff PPE, and extensive cleaning of exam room while observing appropriate contact time as indicated for  disinfecting solutions.

## 2020-03-10 NOTE — Patient Instructions (Signed)
Check the  blood pressure regularly as you are doing  BP GOAL is between 110/65 and  140/85. If it is consistently higher or lower, let me know    GO TO THE LAB : Get the blood work     GO TO THE FRONT DESK, PLEASE SCHEDULE YOUR APPOINTMENTS Come back for a physical exam in about 4 months

## 2020-03-11 LAB — CBC (INCLUDES DIFF/PLT) WITH PATHOLOGIST REVIEW
Absolute Monocytes: 905 cells/uL (ref 200–950)
Basophils Absolute: 80 cells/uL (ref 0–200)
Basophils Relative: 1.1 %
Eosinophils Absolute: 153 cells/uL (ref 15–500)
Eosinophils Relative: 2.1 %
HCT: 46.1 % (ref 38.5–50.0)
Hemoglobin: 15.8 g/dL (ref 13.2–17.1)
Lymphs Abs: 1431 cells/uL (ref 850–3900)
MCH: 32.8 pg (ref 27.0–33.0)
MCHC: 34.3 g/dL (ref 32.0–36.0)
MCV: 95.6 fL (ref 80.0–100.0)
MPV: 8.7 fL (ref 7.5–12.5)
Monocytes Relative: 12.4 %
Neutro Abs: 4730 cells/uL (ref 1500–7800)
Neutrophils Relative %: 64.8 %
Platelets: 204 10*3/uL (ref 140–400)
RBC: 4.82 10*6/uL (ref 4.20–5.80)
RDW: 12.4 % (ref 11.0–15.0)
Total Lymphocyte: 19.6 %
WBC: 7.3 10*3/uL (ref 3.8–10.8)

## 2020-03-11 NOTE — Assessment & Plan Note (Signed)
Prediabetes: Check A1c HTN: Ambulatory BPs never more than 142/80.  No change for now, continue w/ amlodipine, carvedilol (12.5 mg twice daily), clonidine and losartan.  Check CMP. Decreased appetite, decreased energy, some weight loss, bruising: ROS w/ no red flags, physical exam is normal.  For completeness we will check TSH and a CBC with blood smear. Pulmonary nodules: CT 11/2019 showed decreased size of nodules, next CT 1 year. Recommend a flu shot this season RTC CPX about 4 months.

## 2020-03-12 LAB — COMPREHENSIVE METABOLIC PANEL
AG Ratio: 1.2 (calc) (ref 1.0–2.5)
ALT: 12 U/L (ref 9–46)
AST: 20 U/L (ref 10–35)
Albumin: 3.8 g/dL (ref 3.6–5.1)
Alkaline phosphatase (APISO): 71 U/L (ref 35–144)
BUN/Creatinine Ratio: 7 (calc) (ref 6–22)
BUN: 5 mg/dL — ABNORMAL LOW (ref 7–25)
CO2: 26 mmol/L (ref 20–32)
Calcium: 8.8 mg/dL (ref 8.6–10.3)
Chloride: 95 mmol/L — ABNORMAL LOW (ref 98–110)
Creat: 0.7 mg/dL (ref 0.70–1.25)
Globulin: 3.1 g/dL (calc) (ref 1.9–3.7)
Glucose, Bld: 109 mg/dL — ABNORMAL HIGH (ref 65–99)
Potassium: 4.2 mmol/L (ref 3.5–5.3)
Sodium: 129 mmol/L — ABNORMAL LOW (ref 135–146)
Total Bilirubin: 1.2 mg/dL (ref 0.2–1.2)
Total Protein: 6.9 g/dL (ref 6.1–8.1)

## 2020-03-12 LAB — HEMOGLOBIN A1C
Hgb A1c MFr Bld: 5.5 % of total Hgb (ref <5.7)
Mean Plasma Glucose: 111 (calc)
eAG (mmol/L): 6.2 (calc)

## 2020-03-12 LAB — TSH: TSH: 1.52 mIU/L (ref 0.40–4.50)

## 2020-04-11 ENCOUNTER — Other Ambulatory Visit: Payer: Self-pay | Admitting: Internal Medicine

## 2020-04-24 DIAGNOSIS — I499 Cardiac arrhythmia, unspecified: Secondary | ICD-10-CM | POA: Diagnosis not present

## 2020-04-24 DIAGNOSIS — R55 Syncope and collapse: Secondary | ICD-10-CM | POA: Diagnosis not present

## 2020-04-24 DIAGNOSIS — R0689 Other abnormalities of breathing: Secondary | ICD-10-CM | POA: Diagnosis not present

## 2020-04-25 ENCOUNTER — Telehealth: Payer: Self-pay | Admitting: Family Medicine

## 2020-04-25 NOTE — Telephone Encounter (Signed)
Dr. Drue Novel - sorry to pass along this information:  Answering service call 6:30am 04/25/20 from [facility]; nurse supervisor).   Susy Frizzle states that patient went into cardiac arrest. EMS was called and did CPR in the field. They were in communication with the Emergency room physician who gave them the instruction to stop CPR efforts after no improvement. Patient passed this morning.   I did ok for them to send death certificate to you for completion.    -Jannette Spanner

## 2020-04-26 NOTE — Telephone Encounter (Signed)
Thank you. He was a great guy. Will sign the death certificate

## 2020-04-28 ENCOUNTER — Telehealth: Payer: Self-pay | Admitting: Internal Medicine

## 2020-04-28 NOTE — Telephone Encounter (Signed)
[facility]eral home dropped of death certificate to be filled out  For cremation Fax to when done to [phone] Call when ready to pick up [phone]    Gave to CMA

## 2020-04-28 NOTE — Telephone Encounter (Signed)
Received, given to PCP.

## 2020-04-28 NOTE — Telephone Encounter (Signed)
Received Death Certificate- given to PCP.

## 2020-04-28 NOTE — Telephone Encounter (Signed)
[facility] informed that DC is ready for pick up. Copy sent for scanning.

## 2020-04-28 NOTE — Telephone Encounter (Signed)
[employer] informed that DC is ready for pick up. Copy sent for scanning.  

## 2020-04-28 NOTE — Telephone Encounter (Signed)
Patient was 64 years old, he has hypertension, prediabetes, COPD, smoker. Suspect cardiac arrest was a result of CAD.

## 2020-05-04 DIAGNOSIS — 419620001 Death: Secondary | SNOMED CT | POA: Diagnosis not present

## 2020-05-04 DEATH — deceased

## 2020-06-04 ENCOUNTER — Other Ambulatory Visit: Payer: Self-pay | Admitting: Internal Medicine

## 2020-07-08 ENCOUNTER — Encounter: Payer: BC Managed Care – PPO | Admitting: Internal Medicine
